# Patient Record
Sex: Female | Born: 1975 | Race: Black or African American | Hispanic: No | Marital: Single | Smoking: Never smoker
Health system: Southern US, Community
[De-identification: ages and names within clinical notes are randomized; demographics above are authoritative.]

---

## 1997-12-02 ENCOUNTER — Inpatient Hospital Stay (HOSPITAL_COMMUNITY): Admission: AD | Admit: 1997-12-02 | Discharge: 1997-12-02 | Payer: Self-pay | Admitting: Obstetrics & Gynecology

## 1997-12-03 ENCOUNTER — Inpatient Hospital Stay (HOSPITAL_COMMUNITY): Admission: AD | Admit: 1997-12-03 | Discharge: 1997-12-04 | Payer: Self-pay | Admitting: Obstetrics and Gynecology

## 1997-12-04 ENCOUNTER — Encounter (HOSPITAL_COMMUNITY): Admission: RE | Admit: 1997-12-04 | Discharge: 1998-03-04 | Payer: Self-pay | Admitting: *Deleted

## 2003-03-29 ENCOUNTER — Inpatient Hospital Stay (HOSPITAL_COMMUNITY): Admission: AD | Admit: 2003-03-29 | Discharge: 2003-04-01 | Payer: Self-pay | Admitting: Obstetrics and Gynecology

## 2003-05-13 ENCOUNTER — Other Ambulatory Visit: Admission: RE | Admit: 2003-05-13 | Discharge: 2003-05-13 | Payer: Self-pay | Admitting: Obstetrics and Gynecology

## 2005-02-15 ENCOUNTER — Inpatient Hospital Stay (HOSPITAL_COMMUNITY): Admission: EM | Admit: 2005-02-15 | Discharge: 2005-02-15 | Payer: Self-pay | Admitting: Obstetrics & Gynecology

## 2005-02-20 ENCOUNTER — Ambulatory Visit: Payer: Self-pay | Admitting: Family Medicine

## 2005-02-20 ENCOUNTER — Encounter (INDEPENDENT_AMBULATORY_CARE_PROVIDER_SITE_OTHER): Payer: Self-pay | Admitting: *Deleted

## 2005-03-08 ENCOUNTER — Ambulatory Visit: Payer: Self-pay | Admitting: Family Medicine

## 2005-03-21 ENCOUNTER — Ambulatory Visit: Payer: Self-pay | Admitting: Obstetrics & Gynecology

## 2005-04-04 ENCOUNTER — Ambulatory Visit: Payer: Self-pay | Admitting: Obstetrics & Gynecology

## 2005-04-08 ENCOUNTER — Inpatient Hospital Stay (HOSPITAL_COMMUNITY): Admission: AD | Admit: 2005-04-08 | Discharge: 2005-04-10 | Payer: Self-pay | Admitting: Family Medicine

## 2005-04-08 ENCOUNTER — Ambulatory Visit: Payer: Self-pay | Admitting: Family Medicine

## 2005-08-08 ENCOUNTER — Ambulatory Visit: Payer: Self-pay | Admitting: Obstetrics and Gynecology

## 2005-08-08 ENCOUNTER — Encounter: Payer: Self-pay | Admitting: Obstetrics and Gynecology

## 2005-08-10 ENCOUNTER — Emergency Department (HOSPITAL_COMMUNITY): Admission: EM | Admit: 2005-08-10 | Discharge: 2005-08-10 | Payer: Self-pay | Admitting: Emergency Medicine

## 2005-10-24 ENCOUNTER — Ambulatory Visit: Payer: Self-pay | Admitting: Gynecology

## 2006-01-10 ENCOUNTER — Ambulatory Visit: Payer: Self-pay | Admitting: Obstetrics and Gynecology

## 2006-04-17 ENCOUNTER — Ambulatory Visit: Payer: Self-pay | Admitting: Gynecology

## 2006-12-17 ENCOUNTER — Emergency Department (HOSPITAL_COMMUNITY): Admission: EM | Admit: 2006-12-17 | Discharge: 2006-12-17 | Payer: Self-pay | Admitting: Emergency Medicine

## 2006-12-25 ENCOUNTER — Emergency Department (HOSPITAL_COMMUNITY): Admission: EM | Admit: 2006-12-25 | Discharge: 2006-12-25 | Payer: Self-pay | Admitting: Family Medicine

## 2009-08-07 ENCOUNTER — Emergency Department (HOSPITAL_COMMUNITY): Admission: EM | Admit: 2009-08-07 | Discharge: 2009-08-07 | Payer: Self-pay | Admitting: Emergency Medicine

## 2010-05-26 NOTE — Op Note (Signed)
NAME:  Robin Rios, Robin Rios                      ACCOUNT NO.:  [id number]   MEDICAL RECORD NO.:  [id number]                   PATIENT TYPE:  INP   LOCATION:  9107                                 FACILITY:  [facility]   PHYSICIAN:  Juluis Mire, M.D.                DATE OF BIRTH:  02/19/1975   DATE OF PROCEDURE:  03/30/2003  DATE OF DISCHARGE:                                 OPERATIVE REPORT   DELIVERY NOTE:  The patient had a spontaneous vaginal delivery vertex of a  viable female.  Apgars were 8/9.  The pH is still pending.  We did encounter  a moderate shoulder dystocia.  This responded to the McRoberts maneuver.  We  then were able to rotate the posterior shoulder anteriorly, delivering the  infant.  There was some aftercoming meconium.  Previous fluid had been  clear.  The infant was vigorous and viable.  The team was therefore not  called.  Suctioning revealed clear mucus.  The infant was doing well and  moving both upper extremities.  There was no episiotomy.  No tear was noted.  The placenta was delivered intact with a three-vessel cord.  There was a  nuchal cord x1 that was easily reduced.  Anesthesia during labor was the  epidural.  Estimated blood loss was 400 mL.  Mother and baby are doing well  in the postpartum period.                                               Juluis Mire, M.D.    JSM/MEDQ  D:  03/30/2003  T:  03/31/2003  Job:  [id number]

## 2012-04-18 IMAGING — CR DG CERVICAL SPINE 2 OR 3 VIEWS
3 series · 3 of 3 positions shown · non-contrast
Comparison: 12/25/2006

CLINICAL DATA: Muscle spasms and left side of the neck.  No injury.

CERVICAL SPINE - 2-3 VIEW

[w c-spine lat]
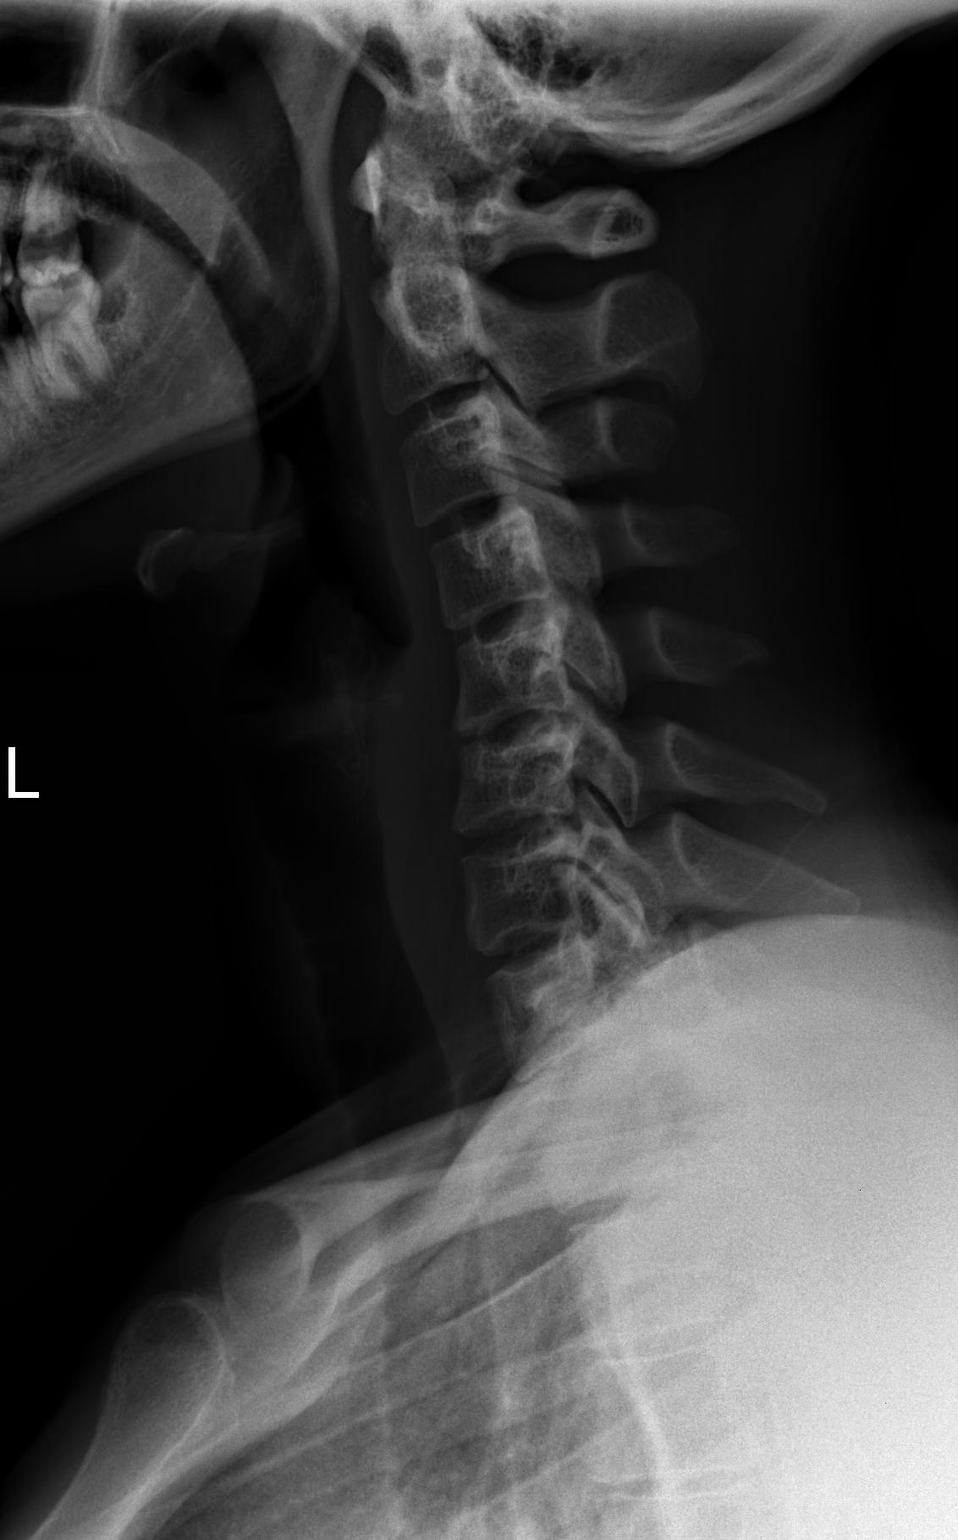

[w c-spine oblique]
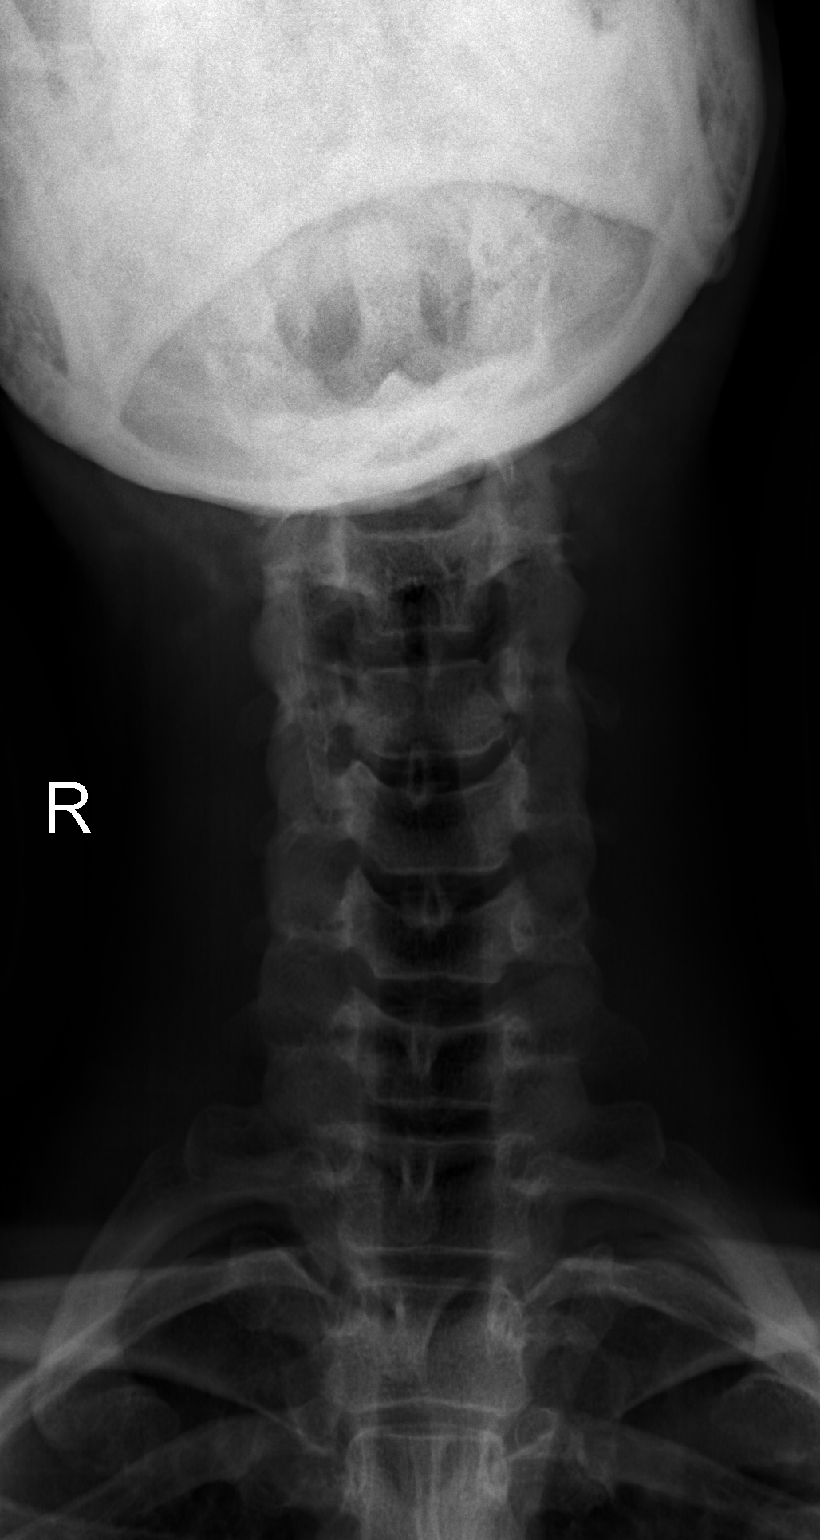

[w c-spine odontoid]
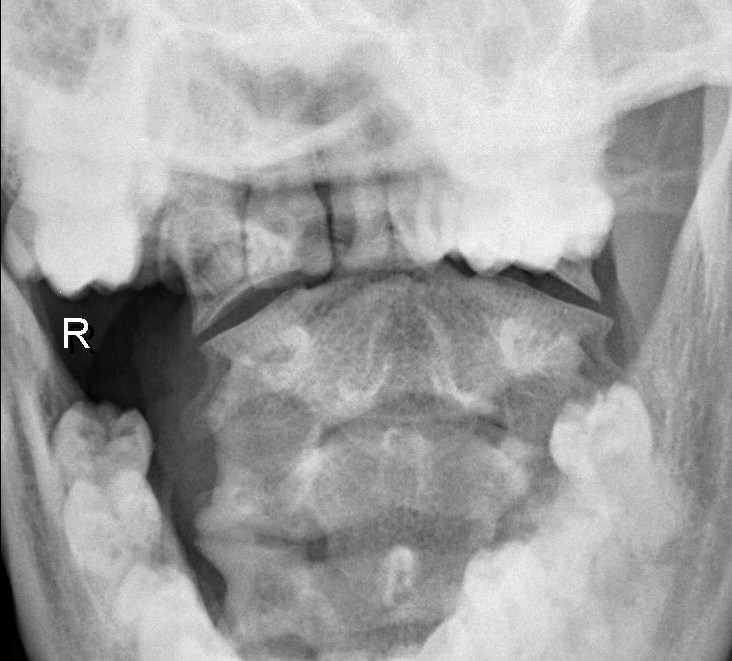

[3 of 3 positions shown; findings below may reference images not displayed]

FINDINGS: There is loss of normal cervical lordosis, similar to
prior study.  Prevertebral soft tissues are normal.  No
subluxation.  No fracture.
IMPRESSION: Cervical straightening.  No acute bony abnormality or change since
prior study.

## 2014-11-05 ENCOUNTER — Other Ambulatory Visit (HOSPITAL_COMMUNITY)
Admission: RE | Admit: 2014-11-05 | Discharge: 2014-11-05 | Disposition: A | Discharge status: Home / Self Care | Payer: Medicaid Other | Source: Ambulatory Visit | Attending: Family Medicine | Admitting: Family Medicine

## 2014-11-05 ENCOUNTER — Ambulatory Visit (INDEPENDENT_AMBULATORY_CARE_PROVIDER_SITE_OTHER): Payer: Medicaid Other | Admitting: Family Medicine

## 2014-11-05 ENCOUNTER — Encounter: Payer: Self-pay | Admitting: Family Medicine

## 2014-11-05 VITALS — BP 127/76 | HR 78 | Temp 98.6°F | Ht 68.0 in | Wt 152.0 lb

## 2014-11-05 DIAGNOSIS — Z124 Encounter for screening for malignant neoplasm of cervix: Secondary | ICD-10-CM

## 2014-11-05 DIAGNOSIS — Z01419 Encounter for gynecological examination (general) (routine) without abnormal findings: Secondary | ICD-10-CM | POA: Diagnosis not present

## 2014-11-05 DIAGNOSIS — Z30431 Encounter for routine checking of intrauterine contraceptive device: Secondary | ICD-10-CM | POA: Diagnosis not present

## 2014-11-05 DIAGNOSIS — Z1151 Encounter for screening for human papillomavirus (HPV): Secondary | ICD-10-CM | POA: Diagnosis not present

## 2014-11-05 DIAGNOSIS — Z975 Presence of (intrauterine) contraceptive device: Secondary | ICD-10-CM | POA: Diagnosis not present

## 2014-11-05 DIAGNOSIS — Z30014 Encounter for initial prescription of intrauterine contraceptive device: Secondary | ICD-10-CM

## 2014-11-05 DIAGNOSIS — Z538 Procedure and treatment not carried out for other reasons: Secondary | ICD-10-CM

## 2014-11-05 NOTE — Patient Instructions (Signed)
Preventive Care for Adults, Female A healthy lifestyle and preventive care can promote health and wellness. Preventive health guidelines for women include the following key practices.  A routine yearly physical is a good way to check with your health care provider about your health and preventive screening. It is a chance to share any concerns and updates on your health and to receive a thorough exam.  Visit your dentist for a routine exam and preventive care every 6 months. Brush your teeth twice a day and floss once a day. Good oral hygiene prevents tooth decay and gum disease.  The frequency of eye exams is based on your age, health, family medical history, use of contact lenses, and other factors. Follow your health care provider's recommendations for frequency of eye exams.  Eat a healthy diet. Foods like vegetables, fruits, whole grains, low-fat dairy products, and lean protein foods contain the nutrients you need without too many calories. Decrease your intake of foods high in solid fats, added sugars, and salt. Eat the right amount of calories for you.Get information about a proper diet from your health care provider, if necessary.  Regular physical exercise is one of the most important things you can do for your health. Most adults should get at least 150 minutes of moderate-intensity exercise (any activity that increases your heart rate and causes you to sweat) each week. In addition, most adults need muscle-strengthening exercises on 2 or more days a week.  Maintain a healthy weight. The body mass index (BMI) is a screening tool to identify possible weight problems. It provides an estimate of body fat based on height and weight. Your health care provider can find your BMI and can help you achieve or maintain a healthy weight.For adults 20 years and older:  A BMI below 18.5 is considered underweight.  A BMI of 18.5 to 24.9 is normal.  A BMI of 25 to 29.9 is considered  overweight.  A BMI of 30 and above is considered obese.  Maintain normal blood lipids and cholesterol levels by exercising and minimizing your intake of saturated fat. Eat a balanced diet with plenty of fruit and vegetables. Blood tests for lipids and cholesterol should begin at age 64 and be repeated every 5 years. If your lipid or cholesterol levels are high, you are over 50, or you are at high risk for heart disease, you may need your cholesterol levels checked more frequently.Ongoing high lipid and cholesterol levels should be treated with medicines if diet and exercise are not working.  If you smoke, find out from your health care provider how to quit. If you do not use tobacco, do not start.  Lung cancer screening is recommended for adults aged 52-80 years who are at high risk for developing lung cancer because of a history of smoking. A yearly low-dose CT scan of the lungs is recommended for people who have at least a 30-pack-year history of smoking and are a current smoker or have quit within the past 15 years. A pack year of smoking is smoking an average of 1 pack of cigarettes a day for 1 year (for example: 1 pack a day for 30 years or 2 packs a day for 15 years). Yearly screening should continue until the smoker has stopped smoking for at least 15 years. Yearly screening should be stopped for people who develop a health problem that would prevent them from having lung cancer treatment.  If you are pregnant, do not drink alcohol. If you are  breastfeeding, be very cautious about drinking alcohol. If you are not pregnant and choose to drink alcohol, do not have more than 1 drink per day. One drink is considered to be 12 ounces (355 mL) of beer, 5 ounces (148 mL) of wine, or 1.5 ounces (44 mL) of liquor.  Avoid use of street drugs. Do not share needles with anyone. Ask for help if you need support or instructions about stopping the use of drugs.  High blood pressure causes heart disease and  increases the risk of stroke. Your blood pressure should be checked at least every 1 to 2 years. Ongoing high blood pressure should be treated with medicines if weight loss and exercise do not work.  If you are 25-78 years old, ask your health care provider if you should take aspirin to prevent strokes.  Diabetes screening is done by taking a blood sample to check your blood glucose level after you have not eaten for a certain period of time (fasting). If you are not overweight and you do not have risk factors for diabetes, you should be screened once every 3 years starting at age 86. If you are overweight or obese and you are 3-87 years of age, you should be screened for diabetes every year as part of your cardiovascular risk assessment.  Breast cancer screening is essential preventive care for women. You should practice "breast self-awareness." This means understanding the normal appearance and feel of your breasts and may include breast self-examination. Any changes detected, no matter how small, should be reported to a health care provider. Women in their 66s and 30s should have a clinical breast exam (CBE) by a health care provider as part of a regular health exam every 1 to 3 years. After age 43, women should have a CBE every year. Starting at age 37, women should consider having a mammogram (breast X-ray test) every year. Women who have a family history of breast cancer should talk to their health care provider about genetic screening. Women at a high risk of breast cancer should talk to their health care providers about having an MRI and a mammogram every year.  Breast cancer gene (BRCA)-related cancer risk assessment is recommended for women who have family members with BRCA-related cancers. BRCA-related cancers include breast, ovarian, tubal, and peritoneal cancers. Having family members with these cancers may be associated with an increased risk for harmful changes (mutations) in the breast  cancer genes BRCA1 and BRCA2. Results of the assessment will determine the need for genetic counseling and BRCA1 and BRCA2 testing.  Your health care provider may recommend that you be screened regularly for cancer of the pelvic organs (ovaries, uterus, and vagina). This screening involves a pelvic examination, including checking for microscopic changes to the surface of your cervix (Pap test). You may be encouraged to have this screening done every 3 years, beginning at age 78.  For women ages 79-65, health care providers may recommend pelvic exams and Pap testing every 3 years, or they may recommend the Pap and pelvic exam, combined with testing for human papilloma virus (HPV), every 5 years. Some types of HPV increase your risk of cervical cancer. Testing for HPV may also be done on women of any age with unclear Pap test results.  Other health care providers may not recommend any screening for nonpregnant women who are considered low risk for pelvic cancer and who do not have symptoms. Ask your health care provider if a screening pelvic exam is right for  you.  If you have had past treatment for cervical cancer or a condition that could lead to cancer, you need Pap tests and screening for cancer for at least 20 years after your treatment. If Pap tests have been discontinued, your risk factors (such as having a new sexual partner) need to be reassessed to determine if screening should resume. Some women have medical problems that increase the chance of getting cervical cancer. In these cases, your health care provider may recommend more frequent screening and Pap tests.  Colorectal cancer can be detected and often prevented. Most routine colorectal cancer screening begins at the age of 50 years and continues through age 75 years. However, your health care provider may recommend screening at an earlier age if you have risk factors for colon cancer. On a yearly basis, your health care provider may provide  home test kits to check for hidden blood in the stool. Use of a small camera at the end of a tube, to directly examine the colon (sigmoidoscopy or colonoscopy), can detect the earliest forms of colorectal cancer. Talk to your health care provider about this at age 50, when routine screening begins. Direct exam of the colon should be repeated every 5-10 years through age 75 years, unless early forms of precancerous polyps or small growths are found.  People who are at an increased risk for hepatitis B should be screened for this virus. You are considered at high risk for hepatitis B if:  You were born in a country where hepatitis B occurs often. Talk with your health care provider about which countries are considered high risk.  Your parents were born in a high-risk country and you have not received a shot to protect against hepatitis B (hepatitis B vaccine).  You have HIV or AIDS.  You use needles to inject street drugs.  You live with, or have sex with, someone who has hepatitis B.  You get hemodialysis treatment.  You take certain medicines for conditions like cancer, organ transplantation, and autoimmune conditions.  Hepatitis C blood testing is recommended for all people born from 1945 through 1965 and any individual with known risks for hepatitis C.  Practice safe sex. Use condoms and avoid high-risk sexual practices to reduce the spread of sexually transmitted infections (STIs). STIs include gonorrhea, chlamydia, syphilis, trichomonas, herpes, HPV, and human immunodeficiency virus (HIV). Herpes, HIV, and HPV are viral illnesses that have no cure. They can result in disability, cancer, and death.  You should be screened for sexually transmitted illnesses (STIs) including gonorrhea and chlamydia if:  You are sexually active and are younger than 24 years.  You are older than 24 years and your health care provider tells you that you are at risk for this type of infection.  Your sexual  activity has changed since you were last screened and you are at an increased risk for chlamydia or gonorrhea. Ask your health care provider if you are at risk.  If you are at risk of being infected with HIV, it is recommended that you take a prescription medicine daily to prevent HIV infection. This is called preexposure prophylaxis (PrEP). You are considered at risk if:  You are sexually active and do not regularly use condoms or know the HIV status of your partner(s).  You take drugs by injection.  You are sexually active with a partner who has HIV.  Talk with your health care provider about whether you are at high risk of being infected with HIV. If   you choose to begin PrEP, you should first be tested for HIV. You should then be tested every 3 months for as long as you are taking PrEP.  Osteoporosis is a disease in which the bones lose minerals and strength with aging. This can result in serious bone fractures or breaks. The risk of osteoporosis can be identified using a bone density scan. Women ages 1 years and over and women at risk for fractures or osteoporosis should discuss screening with their health care providers. Ask your health care provider whether you should take a calcium supplement or vitamin D to reduce the rate of osteoporosis.  Menopause can be associated with physical symptoms and risks. Hormone replacement therapy is available to decrease symptoms and risks. You should talk to your health care provider about whether hormone replacement therapy is right for you.  Use sunscreen. Apply sunscreen liberally and repeatedly throughout the day. You should seek shade when your shadow is shorter than you. Protect yourself by wearing long sleeves, pants, a wide-brimmed hat, and sunglasses year round, whenever you are outdoors.  Once a month, do a whole body skin exam, using a mirror to look at the skin on your back. Tell your health care provider of new moles, moles that have irregular  borders, moles that are larger than a pencil eraser, or moles that have changed in shape or color.  Stay current with required vaccines (immunizations).  Influenza vaccine. All adults should be immunized every year.  Tetanus, diphtheria, and acellular pertussis (Td, Tdap) vaccine. Pregnant women should receive 1 dose of Tdap vaccine during each pregnancy. The dose should be obtained regardless of the length of time since the last dose. Immunization is preferred during the 27th-36th week of gestation. An adult who has not previously received Tdap or who does not know her vaccine status should receive 1 dose of Tdap. This initial dose should be followed by tetanus and diphtheria toxoids (Td) booster doses every 10 years. Adults with an unknown or incomplete history of completing a 3-dose immunization series with Td-containing vaccines should begin or complete a primary immunization series including a Tdap dose. Adults should receive a Td booster every 10 years.  Varicella vaccine. An adult without evidence of immunity to varicella should receive 2 doses or a second dose if she has previously received 1 dose. Pregnant females who do not have evidence of immunity should receive the first dose after pregnancy. This first dose should be obtained before leaving the health care facility. The second dose should be obtained 4-8 weeks after the first dose.  Human papillomavirus (HPV) vaccine. Females aged 13-26 years who have not received the vaccine previously should obtain the 3-dose series. The vaccine is not recommended for use in pregnant females. However, pregnancy testing is not needed before receiving a dose. If a female is found to be pregnant after receiving a dose, no treatment is needed. In that case, the remaining doses should be delayed until after the pregnancy. Immunization is recommended for any person with an immunocompromised condition through the age of 24 years if she did not get any or all doses  earlier. During the 3-dose series, the second dose should be obtained 4-8 weeks after the first dose. The third dose should be obtained 24 weeks after the first dose and 16 weeks after the second dose.  Zoster vaccine. One dose is recommended for adults aged 97 years or older unless certain conditions are present.  Measles, mumps, and rubella (MMR) vaccine. Adults born  before 1957 generally are considered immune to measles and mumps. Adults born in [birth year] or later should have 1 or more doses of MMR vaccine unless there is a contraindication to the vaccine or there is laboratory evidence of immunity to each of the three diseases. A routine second dose of MMR vaccine should be obtained at least 28 days after the first dose for students attending postsecondary schools, health care workers, or international travelers. People who received inactivated measles vaccine or an unknown type of measles vaccine during 1963-1967 should receive 2 doses of MMR vaccine. People who received inactivated mumps vaccine or an unknown type of mumps vaccine before 1979 and are at high risk for mumps infection should consider immunization with 2 doses of MMR vaccine. For females of childbearing age, rubella immunity should be determined. If there is no evidence of immunity, females who are not pregnant should be vaccinated. If there is no evidence of immunity, females who are pregnant should delay immunization until after pregnancy. Unvaccinated health care workers born before 60 who lack laboratory evidence of measles, mumps, or rubella immunity or laboratory confirmation of disease should consider measles and mumps immunization with 2 doses of MMR vaccine or rubella immunization with 1 dose of MMR vaccine.  Pneumococcal 13-valent conjugate (PCV13) vaccine. When indicated, a person who is uncertain of his immunization history and has no record of immunization should receive the PCV13 vaccine. All adults 61 years of age and older  should receive this vaccine. An adult aged 92 years or older who has certain medical conditions and has not been previously immunized should receive 1 dose of PCV13 vaccine. This PCV13 should be followed with a dose of pneumococcal polysaccharide (PPSV23) vaccine. Adults who are at high risk for pneumococcal disease should obtain the PPSV23 vaccine at least 8 weeks after the dose of PCV13 vaccine. Adults older than 40 years of age who have normal immune system function should obtain the PPSV23 vaccine dose at least 1 year after the dose of PCV13 vaccine.  Pneumococcal polysaccharide (PPSV23) vaccine. When PCV13 is also indicated, PCV13 should be obtained first. All adults aged 2 years and older should be immunized. An adult younger than age 30 years who has certain medical conditions should be immunized. Any person who resides in a nursing home or long-term care facility should be immunized. An adult smoker should be immunized. People with an immunocompromised condition and certain other conditions should receive both PCV13 and PPSV23 vaccines. People with human immunodeficiency virus (HIV) infection should be immunized as soon as possible after diagnosis. Immunization during chemotherapy or radiation therapy should be avoided. Routine use of PPSV23 vaccine is not recommended for American Indians, [place] Natives, or people younger than 65 years unless there are medical conditions that require PPSV23 vaccine. When indicated, people who have unknown immunization and have no record of immunization should receive PPSV23 vaccine. One-time revaccination 5 years after the first dose of PPSV23 is recommended for people aged 19-64 years who have chronic kidney failure, nephrotic syndrome, asplenia, or immunocompromised conditions. People who received 1-2 doses of PPSV23 before age 44 years should receive another dose of PPSV23 vaccine at age 83 years or later if at least 5 years have passed since the previous dose. Doses  of PPSV23 are not needed for people immunized with PPSV23 at or after age 20 years.  Meningococcal vaccine. Adults with asplenia or persistent complement component deficiencies should receive 2 doses of quadrivalent meningococcal conjugate (MenACWY-D) vaccine. The doses should be obtained  at least 2 months apart. Microbiologists working with certain meningococcal bacteria, [place] recruits, people at risk during an outbreak, and people who travel to or live in countries with a high rate of meningitis should be immunized. A first-year college student up through age 28 years who is living in a residence hall should receive a dose if she did not receive a dose on or after her 16th birthday. Adults who have certain high-risk conditions should receive one or more doses of vaccine.  Hepatitis A vaccine. Adults who wish to be protected from this disease, have certain high-risk conditions, work with hepatitis A-infected animals, work in hepatitis A research labs, or travel to or work in countries with a high rate of hepatitis A should be immunized. Adults who were previously unvaccinated and who anticipate close contact with an international adoptee during the first 60 days after arrival in the [place] from a country with a high rate of hepatitis A should be immunized.  Hepatitis B vaccine. Adults who wish to be protected from this disease, have certain high-risk conditions, may be exposed to blood or other infectious body fluids, are household contacts or sex partners of hepatitis B positive people, are clients or workers in certain care facilities, or travel to or work in countries with a high rate of hepatitis B should be immunized.  Haemophilus influenzae type b (Hib) vaccine. A previously unvaccinated person with asplenia or sickle cell disease or having a scheduled splenectomy should receive 1 dose of Hib vaccine. Regardless of previous immunization, a recipient of a hematopoietic stem cell transplant  should receive a 3-dose series 6-12 months after her successful transplant. Hib vaccine is not recommended for adults with HIV infection. Preventive Services / Frequency Ages 71 to 87 years  Blood pressure check.** / Every 3-5 years.  Lipid and cholesterol check.** / Every 5 years beginning at age 1.  Clinical breast exam.** / Every 3 years for women in their 3s and 31s.  BRCA-related cancer risk assessment.** / For women who have family members with a BRCA-related cancer (breast, ovarian, tubal, or peritoneal cancers).  Pap test.** / Every 2 years from ages 50 through 86. Every 3 years starting at age 87 through age 7 or 75 with a history of 3 consecutive normal Pap tests.  HPV screening.** / Every 3 years from ages 59 through ages 35 to 6 with a history of 3 consecutive normal Pap tests.  Hepatitis C blood test.** / For any individual with known risks for hepatitis C.  Skin self-exam. / Monthly.  Influenza vaccine. / Every year.  Tetanus, diphtheria, and acellular pertussis (Tdap, Td) vaccine.** / Consult your health care provider. Pregnant women should receive 1 dose of Tdap vaccine during each pregnancy. 1 dose of Td every 10 years.  Varicella vaccine.** / Consult your health care provider. Pregnant females who do not have evidence of immunity should receive the first dose after pregnancy.  HPV vaccine. / 3 doses over 6 months, if 72 and younger. The vaccine is not recommended for use in pregnant females. However, pregnancy testing is not needed before receiving a dose.  Measles, mumps, rubella (MMR) vaccine.** / You need at least 1 dose of MMR if you were born in 1957 or later. You may also need a 2nd dose. For females of childbearing age, rubella immunity should be determined. If there is no evidence of immunity, females who are not pregnant should be vaccinated. If there is no evidence of immunity, females who are  pregnant should delay immunization until after  pregnancy.  Pneumococcal 13-valent conjugate (PCV13) vaccine.** / Consult your health care provider.  Pneumococcal polysaccharide (PPSV23) vaccine.** / 1 to 2 doses if you smoke cigarettes or if you have certain conditions.  Meningococcal vaccine.** / 1 dose if you are age 87 to 44 years and a Market researcher living in a residence hall, or have one of several medical conditions, you need to get vaccinated against meningococcal disease. You may also need additional booster doses.  Hepatitis A vaccine.** / Consult your health care provider.  Hepatitis B vaccine.** / Consult your health care provider.  Haemophilus influenzae type b (Hib) vaccine.** / Consult your health care provider. Ages 86 to 38 years  Blood pressure check.** / Every year.  Lipid and cholesterol check.** / Every 5 years beginning at age 49 years.  Lung cancer screening. / Every year if you are aged 71-80 years and have a 30-pack-year history of smoking and currently smoke or have quit within the past 15 years. Yearly screening is stopped once you have quit smoking for at least 15 years or develop a health problem that would prevent you from having lung cancer treatment.  Clinical breast exam.** / Every year after age 51 years.  BRCA-related cancer risk assessment.** / For women who have family members with a BRCA-related cancer (breast, ovarian, tubal, or peritoneal cancers).  Mammogram.** / Every year beginning at age 18 years and continuing for as long as you are in good health. Consult with your health care provider.  Pap test.** / Every 3 years starting at age 63 years through age 37 or 57 years with a history of 3 consecutive normal Pap tests.  HPV screening.** / Every 3 years from ages 41 years through ages 76 to 23 years with a history of 3 consecutive normal Pap tests.  Fecal occult blood test (FOBT) of stool. / Every year beginning at age 36 years and continuing until age 51 years. You may not need  to do this test if you get a colonoscopy every 10 years.  Flexible sigmoidoscopy or colonoscopy.** / Every 5 years for a flexible sigmoidoscopy or every 10 years for a colonoscopy beginning at age 36 years and continuing until age 35 years.  Hepatitis C blood test.** / For all people born from [birth year] through 1965 and any individual with known risks for hepatitis C.  Skin self-exam. / Monthly.  Influenza vaccine. / Every year.  Tetanus, diphtheria, and acellular pertussis (Tdap/Td) vaccine.** / Consult your health care provider. Pregnant women should receive 1 dose of Tdap vaccine during each pregnancy. 1 dose of Td every 10 years.  Varicella vaccine.** / Consult your health care provider. Pregnant females who do not have evidence of immunity should receive the first dose after pregnancy.  Zoster vaccine.** / 1 dose for adults aged 73 years or older.  Measles, mumps, rubella (MMR) vaccine.** / You need at least 1 dose of MMR if you were born in 1957 or later. You may also need a second dose. For females of childbearing age, rubella immunity should be determined. If there is no evidence of immunity, females who are not pregnant should be vaccinated. If there is no evidence of immunity, females who are pregnant should delay immunization until after pregnancy.  Pneumococcal 13-valent conjugate (PCV13) vaccine.** / Consult your health care provider.  Pneumococcal polysaccharide (PPSV23) vaccine.** / 1 to 2 doses if you smoke cigarettes or if you have certain conditions.  Meningococcal vaccine.** /  Consult your health care provider.  Hepatitis A vaccine.** / Consult your health care provider.  Hepatitis B vaccine.** / Consult your health care provider.  Haemophilus influenzae type b (Hib) vaccine.** / Consult your health care provider. Ages 80 years and over  Blood pressure check.** / Every year.  Lipid and cholesterol check.** / Every 5 years beginning at age 62 years.  Lung cancer  screening. / Every year if you are aged 32-80 years and have a 30-pack-year history of smoking and currently smoke or have quit within the past 15 years. Yearly screening is stopped once you have quit smoking for at least 15 years or develop a health problem that would prevent you from having lung cancer treatment.  Clinical breast exam.** / Every year after age 61 years.  BRCA-related cancer risk assessment.** / For women who have family members with a BRCA-related cancer (breast, ovarian, tubal, or peritoneal cancers).  Mammogram.** / Every year beginning at age 39 years and continuing for as long as you are in good health. Consult with your health care provider.  Pap test.** / Every 3 years starting at age 85 years through age 74 or 72 years with 3 consecutive normal Pap tests. Testing can be stopped between 65 and 70 years with 3 consecutive normal Pap tests and no abnormal Pap or HPV tests in the past 10 years.  HPV screening.** / Every 3 years from ages 55 years through ages 67 or 77 years with a history of 3 consecutive normal Pap tests. Testing can be stopped between 65 and 70 years with 3 consecutive normal Pap tests and no abnormal Pap or HPV tests in the past 10 years.  Fecal occult blood test (FOBT) of stool. / Every year beginning at age 81 years and continuing until age 22 years. You may not need to do this test if you get a colonoscopy every 10 years.  Flexible sigmoidoscopy or colonoscopy.** / Every 5 years for a flexible sigmoidoscopy or every 10 years for a colonoscopy beginning at age 67 years and continuing until age 22 years.  Hepatitis C blood test.** / For all people born from [birth year] through 1966 and any individual with known risks for hepatitis C.  Osteoporosis screening.** / A one-time screening for women ages 8 years and over and women at risk for fractures or osteoporosis.  Skin self-exam. / Monthly.  Influenza vaccine. / Every year.  Tetanus, diphtheria, and  acellular pertussis (Tdap/Td) vaccine.** / 1 dose of Td every 10 years.  Varicella vaccine.** / Consult your health care provider.  Zoster vaccine.** / 1 dose for adults aged 56 years or older.  Pneumococcal 13-valent conjugate (PCV13) vaccine.** / Consult your health care provider.  Pneumococcal polysaccharide (PPSV23) vaccine.** / 1 dose for all adults aged 15 years and older.  Meningococcal vaccine.** / Consult your health care provider.  Hepatitis A vaccine.** / Consult your health care provider.  Hepatitis B vaccine.** / Consult your health care provider.  Haemophilus influenzae type b (Hib) vaccine.** / Consult your health care provider. ** Family history and personal history of risk and conditions may change your health care provider's recommendations.   This information is not intended to replace advice given to you by your health care provider. Make sure you discuss any questions you have with your health care provider.   Document Released: 02/20/2001 Document Revised: 01/15/2014 Document Reviewed: 05/22/2010 Elsevier Interactive Patient Education [employer].

## 2014-11-05 NOTE — Progress Notes (Signed)
  Subjective:     Robin Rios is a 39 y.o. female and is here for a comprehensive physical exam. The patient reports no problems.  Has IUD that was placed 5 years ago.  No problems.  Infrequently sees physician  Social History   Social History  . Marital Status: Single    Spouse Name: N/A  . Number of Children: N/A  . Years of Education: N/A   Occupational History  . Not on file.   Social History Main Topics  . Smoking status: Never Smoker   . Smokeless tobacco: Never Used  . Alcohol Use: No  . Drug Use: Yes    Special: Marijuana     Comment: occasionally  . Sexual Activity: No   Other Topics Concern  . Not on file   Social History Narrative  . No narrative on file   Health Maintenance  Topic Date Due  . HIV Screening  05/10/1989  . TETANUS/TDAP  05/10/1993  . PAP SMEAR  05/11/1995  . INFLUENZA VACCINE  08/09/2014    The following portions of the patient's history were reviewed and updated as appropriate: allergies, current medications, past family history, past medical history, past social history, past surgical history and problem list.  Review of Systems Pertinent items are noted in HPI.   Objective:    BP 127/76 mmHg  Pulse 78  Temp(Src) 98.6 F (37 C) (Oral)  Ht 5\' 8"  (1.727 m)  Wt 152 lb (68.947 kg)  BMI 23.12 kg/m2  LMP 10/25/2014 General appearance: alert, cooperative and no distress Head: Normocephalic, without obvious abnormality, atraumatic Eyes: conjunctivae/corneas clear. PERRL, EOM's intact. Fundi benign. Neck: no adenopathy, no carotid bruit, no JVD, supple, symmetrical, trachea midline and thyroid not enlarged, symmetric, no tenderness/mass/nodules Lungs: clear to auscultation bilaterally Breasts: normal appearance, no masses or tenderness Heart: regular rate and rhythm, S1, S2 normal, no murmur, click, rub or gallop Abdomen: soft, non-tender; bowel sounds normal; no masses,  no organomegaly Pelvic: cervix normal in appearance,  external genitalia normal, no adnexal masses or tenderness, no cervical motion tenderness, rectovaginal septum normal, uterus normal size, shape, and consistency and vagina normal without discharge.  No strings visible Extremities: extremities normal, atraumatic, no cyanosis or edema Pulses: 2+ and symmetric Skin: Skin color, texture, turgor normal. No rashes or lesions Neurologic: Alert and oriented X 3, normal strength and tone. Normal symmetric reflexes. Normal coordination and gait    Assessment:    Healthy female exam. Retained IUD      Plan:      PAP done Attempted to use cytobrush to grasp IUD strings, but was unsuccessful.  Also tried to use long kelly clamp and IUD hook, but was unsuccessful.  Schedule US to confirm IUD presence.  Fasting lipids, CMP.  See After Visit Summary for Counseling Recommendations

## 2014-11-06 LAB — WET PREP, GENITAL
Trich, Wet Prep: NONE SEEN
YEAST WET PREP: NONE SEEN

## 2014-11-08 ENCOUNTER — Other Ambulatory Visit: Payer: Self-pay | Admitting: Family Medicine

## 2014-11-08 MED ORDER — METRONIDAZOLE 500 MG PO TABS: 500.0000 mg | ORAL_TABLET | Freq: Two times a day (BID) | ORAL | Status: DC

## 2014-11-09 ENCOUNTER — Telehealth: Payer: Self-pay | Admitting: General Practice

## 2014-11-09 NOTE — Telephone Encounter (Signed)
Per Dr Adrian BlackwaterStinson, patient has BV. Flagyl has been sent to pharmacy. Called patient, no answer- unable to leave message. Phone kept ringing

## 2014-11-10 ENCOUNTER — Ambulatory Visit (HOSPITAL_COMMUNITY)
Admission: RE | Admit: 2014-11-10 | Discharge: 2014-11-10 | Disposition: A | Discharge status: Home / Self Care | Payer: Medicaid Other | Source: Ambulatory Visit | Attending: Family Medicine | Admitting: Family Medicine

## 2014-11-10 ENCOUNTER — Telehealth: Payer: Self-pay | Admitting: General Practice

## 2014-11-10 DIAGNOSIS — Z975 Presence of (intrauterine) contraceptive device: Secondary | ICD-10-CM

## 2014-11-10 DIAGNOSIS — Z30431 Encounter for routine checking of intrauterine contraceptive device: Secondary | ICD-10-CM | POA: Insufficient documentation

## 2014-11-10 DIAGNOSIS — Z538 Procedure and treatment not carried out for other reasons: Secondary | ICD-10-CM

## 2014-11-10 LAB — CYTOLOGY - PAP

## 2014-11-10 NOTE — Telephone Encounter (Signed)
Per Dr Adrian BlackwaterStinson, + HPV on pap. needs PAP and HPV in 1 year. Also, US showed IUD. Will schedule her for hysteroscopy to remove IUD. Called patient and phone kept ringing. Called emergency contact and her mother states the patient isn't in right now but would tell her we called.

## 2014-11-10 NOTE — Telephone Encounter (Signed)
Opened in error

## 2014-11-15 NOTE — Telephone Encounter (Signed)
Patient called and left message stating she is calling for her results. Patient states her other phone is messing up and she has a new number of [phone]. Patient states we can reach her there. Called patient and informed her of all results and recommendations. Discussed with patient that she will be receiving a letter in the mail and to call us the week of thanksgiving if she hasn't received that letter. Patient verbalized understanding to all and is aware to avoid alcohol while on flagyl. Patient had no questions

## 2014-11-24 ENCOUNTER — Ambulatory Visit
Admission: RE | Admit: 2014-11-24 | Discharge: 2014-11-24 | Disposition: A | Discharge status: Home / Self Care | Payer: Medicaid Other | Source: Ambulatory Visit | Attending: Family Medicine | Admitting: Family Medicine

## 2014-11-24 DIAGNOSIS — Z01419 Encounter for gynecological examination (general) (routine) without abnormal findings: Secondary | ICD-10-CM

## 2014-12-01 ENCOUNTER — Encounter (HOSPITAL_COMMUNITY): Payer: Self-pay | Admitting: *Deleted

## 2015-02-01 ENCOUNTER — Encounter (HOSPITAL_COMMUNITY)
Admission: RE | Disposition: A | Discharge status: Home / Self Care | Payer: Self-pay | Source: Ambulatory Visit | Attending: Obstetrics & Gynecology

## 2015-02-01 ENCOUNTER — Ambulatory Visit (HOSPITAL_COMMUNITY)
Admission: RE | Admit: 2015-02-01 | Discharge: 2015-02-01 | Disposition: A | Discharge status: Home / Self Care | Payer: Medicaid Other | Source: Ambulatory Visit | Attending: Obstetrics & Gynecology | Admitting: Obstetrics & Gynecology

## 2015-02-01 ENCOUNTER — Ambulatory Visit (HOSPITAL_COMMUNITY): Payer: Medicaid Other | Admitting: Anesthesiology

## 2015-02-01 ENCOUNTER — Encounter (HOSPITAL_COMMUNITY): Payer: Self-pay | Admitting: *Deleted

## 2015-02-01 DIAGNOSIS — Z30433 Encounter for removal and reinsertion of intrauterine contraceptive device: Secondary | ICD-10-CM

## 2015-02-01 DIAGNOSIS — T8332XD Displacement of intrauterine contraceptive device, subsequent encounter: Secondary | ICD-10-CM

## 2015-02-01 HISTORY — PX: IUD REMOVAL: SHX5392

## 2015-02-01 LAB — CBC
HCT: 42.6 % (ref 36.0–46.0)
Hemoglobin: 14.5 g/dL (ref 12.0–15.0)
MCH: 32.7 pg (ref 26.0–34.0)
MCHC: 34 g/dL (ref 30.0–36.0)
MCV: 95.9 fL (ref 78.0–100.0)
PLATELETS: 209 10*3/uL (ref 150–400)
RBC: 4.44 MIL/uL (ref 3.87–5.11)
RDW: 13.2 % (ref 11.5–15.5)
WBC: 8.1 10*3/uL (ref 4.0–10.5)

## 2015-02-01 LAB — PREGNANCY, URINE: PREG TEST UR: NEGATIVE

## 2015-02-01 SURGERY — REMOVAL, INTRAUTERINE DEVICE
Anesthesia: Monitor Anesthesia Care | Site: Vagina

## 2015-02-01 MED ORDER — MIDAZOLAM HCL 2 MG/2ML IJ SOLN
INTRAMUSCULAR | Status: DC | PRN
  Administered 2015-02-01: 2 mg via INTRAVENOUS

## 2015-02-01 MED ORDER — LACTATED RINGERS IV SOLN: INTRAVENOUS | Status: DC

## 2015-02-01 MED ORDER — IBUPROFEN 600 MG PO TABS: 600.0000 mg | ORAL_TABLET | Freq: Four times a day (QID) | ORAL | Status: DC | PRN

## 2015-02-01 MED ORDER — FENTANYL CITRATE (PF) 100 MCG/2ML IJ SOLN
INTRAMUSCULAR | Status: DC | PRN
  Administered 2015-02-01: 100 ug via INTRAVENOUS
  Administered 2015-02-01: 50 ug via INTRAVENOUS

## 2015-02-01 MED ORDER — KETOROLAC TROMETHAMINE 30 MG/ML IJ SOLN
INTRAMUSCULAR | Status: AC
  Filled 2015-02-01: qty 1

## 2015-02-01 MED ORDER — KETOROLAC TROMETHAMINE 30 MG/ML IJ SOLN
INTRAMUSCULAR | Status: DC | PRN
  Administered 2015-02-01: 30 mg via INTRAVENOUS

## 2015-02-01 MED ORDER — PROPOFOL 500 MG/50ML IV EMUL
INTRAVENOUS | Status: DC | PRN
Start: 2015-02-01 — End: 2015-02-01
  Administered 2015-02-01: 150 ug/kg/min via INTRAVENOUS

## 2015-02-01 MED ORDER — CITRIC ACID-SODIUM CITRATE 334-500 MG/5ML PO SOLN
ORAL | Status: AC
  Filled 2015-02-01: qty 15

## 2015-02-01 MED ORDER — ONDANSETRON HCL 4 MG/2ML IJ SOLN
INTRAMUSCULAR | Status: DC | PRN
  Administered 2015-02-01: 4 mg via INTRAVENOUS

## 2015-02-01 MED ORDER — MIDAZOLAM HCL 2 MG/2ML IJ SOLN
INTRAMUSCULAR | Status: AC
  Filled 2015-02-01: qty 2

## 2015-02-01 MED ORDER — FENTANYL CITRATE (PF) 250 MCG/5ML IJ SOLN
INTRAMUSCULAR | Status: AC
  Filled 2015-02-01: qty 5

## 2015-02-01 MED ORDER — PROPOFOL 10 MG/ML IV BOLUS
INTRAVENOUS | Status: AC
  Filled 2015-02-01: qty 40

## 2015-02-01 MED ORDER — SCOPOLAMINE 1 MG/3DAYS TD PT72
MEDICATED_PATCH | TRANSDERMAL | Status: AC
  Filled 2015-02-01: qty 1

## 2015-02-01 MED ORDER — CITRIC ACID-SODIUM CITRATE 334-500 MG/5ML PO SOLN
30.0000 mL | ORAL | Status: AC
  Administered 2015-02-01: 30 mL via ORAL

## 2015-02-01 MED ORDER — SCOPOLAMINE 1 MG/3DAYS TD PT72
1.0000 | MEDICATED_PATCH | TRANSDERMAL | Status: DC
  Administered 2015-02-01: 1.5 mg via TRANSDERMAL

## 2015-02-01 MED ORDER — LACTATED RINGERS IV SOLN
INTRAVENOUS | Status: DC
  Administered 2015-02-01 (×2): via INTRAVENOUS

## 2015-02-01 MED ORDER — ONDANSETRON HCL 4 MG/2ML IJ SOLN
INTRAMUSCULAR | Status: AC
Start: 2015-02-01 — End: 2015-02-01
  Filled 2015-02-01: qty 2

## 2015-02-01 SURGICAL SUPPLY — 12 items
CLOTH BEACON ORANGE TIMEOUT ST (SAFETY) ×3 IMPLANT
GLOVE BIO SURGEON STRL SZ 6.5 (GLOVE) ×2 IMPLANT
GLOVE BIO SURGEONS STRL SZ 6.5 (GLOVE) ×1
GLOVE BIOGEL PI IND STRL 7.0 (GLOVE) ×3 IMPLANT
GLOVE BIOGEL PI INDICATOR 7.0 (GLOVE) ×6
GLOVE ECLIPSE 7.0 STRL STRAW (GLOVE) ×3 IMPLANT
GOWN STRL REUS W/TWL LRG LVL3 (GOWN DISPOSABLE) ×6 IMPLANT
NDL SPNL 22GX3.5 QUINCKE BK (NEEDLE) IMPLANT
NEEDLE SPNL 22GX3.5 QUINCKE BK (NEEDLE) IMPLANT
PACK VAGINAL MINOR WOMEN LF (CUSTOM PROCEDURE TRAY) ×3 IMPLANT
TOWEL OR 17X24 6PK STRL BLUE (TOWEL DISPOSABLE) ×6 IMPLANT
mirena ×2 IMPLANT

## 2015-02-01 NOTE — Progress Notes (Signed)
PROCEDURE DATE: 01/14/2014  PREOPERATIVE DIAGNOSES: Embedded IUD, failed attempts of in office removal, desires replacement POSTOPERATIVE DIAGNOSES: The same PROCEDURE: IUD removal under anesthesia and replacement SURGEON: Dr. Jaynie Collins ANESTHESIOLOGIST: Dr. Cristela Blue  INDICATIONS: 40 y.o. [id number] here for IUD removal under anesthesia secondary to failed in office IUD removal attempt.  Also desires reinsertion of new IUD. Discussed risks of irregular bleeding, cramping, infection, malpositioning or misplacement of the IUD outside the uterus which may require further procedures. Also advised to use backup contraception for one week as the risk of pregnancy is higher during the transition period of removing an IUD and replacing it with another one. The patient concurred with the proposed plan, giving informed written consent for the surgery  FINDINGS: Small uterus, nulliparous cervix. IUD removed easily with polyp forceps after prepping cervix. New Mirena inserted and strings cut to 2 cm.  ANESTHESIA: MAC ESTIMATED BLOOD LOSS: None COMPLICATIONS: None immediate  PROCEDURE IN DETAIL: The patient was then taken to the operating room where MAC anesthesia was administered and was found to be adequate. She was placed in the dorsal lithotomy position, and was prepped and draped in a sterile manner. After an adequate timeout was performed, attention was turned to her pelvis where a speculum was placed in the vagina.  The cervix was visualized and grasped anteriorly with a single tooth tenaculum.  Local analgesia was administered.  The strings of the IUD were not visualized, so polyp forceps were introduced into the endometrium. The IUD was grasped and removed in its entirety.  The new Mirena IUD insertion apparatus was used to sound the uterus to 9 cm;  the IUD was then placed per manufacturer's recommendations. Strings trimmed to 2 cm. Tenaculum was removed, good hemostasis noted. Patient  tolerated procedure well.   Patient was given post-procedure instructions.   She was reminded to have backup contraception for one week during this transition period between IUDs.  Patient was also asked to check IUD strings periodically and follow up in 4 weeks for IUD check.  The patient will be discharged to home as per PACU criteria.   Jaynie Collins, MD, FACOG Attending Obstetrician & Gynecologist, [facility]

## 2015-02-01 NOTE — H&P (Signed)
Preoperative History and Physical  Robin Rios is a 40 y.o. [id number] here for IUD removal and reinsertion s/p failed attempt in office.   No significant preoperative concerns.  Proposed surgery: Mirena IUD removal and reinsertion  Past Medical History  Diagnosis Date  . Medical history non-contributory   . Vaginal delivery 1999, 2005, 2007   Past Surgical History  Procedure Laterality Date  . No past surgeries     OB History  Gravida Para Term Preterm AB SAB TAB Ectopic Multiple Living  0 2 0 2 0 0 3    # Outcome Date GA Lbr Len/2nd Weight Sex Delivery Anes PTL Lv  5 TAB           4 TAB           3 Term           2 Term           1 Term             Patient denies any other pertinent gynecologic issues.   No current facility-administered medications on file prior to encounter.   Current Outpatient Prescriptions on File Prior to Encounter  Medication Sig Dispense Refill  . metroNIDAZOLE (FLAGYL) 500 MG tablet Take 1 tablet (500 mg total) by mouth 2 (two) times daily. (Patient not taking: Reported on 01/20/2015) 14 tablet 0   No Known Allergies  Social History:   reports that she has never smoked. She has never used smokeless tobacco. She reports that she uses illicit drugs (Marijuana). She reports that she does not drink alcohol.  History reviewed. No pertinent family history.  Review of Systems: Noncontributory  PHYSICAL EXAM: Blood pressure 114/78, pulse 66, temperature 97.9 F (36.6 C), temperature source Oral, resp. rate 18, height  (1.727 m), weight 152 lb (68.947 kg), SpO2 100 %. CONSTITUTIONAL: Well-developed, well-nourished female in no acute distress.  HENT:  Normocephalic, atraumatic, External right and left ear normal. Oropharynx is clear and moist EYES: Conjunctivae and EOM are normal. Pupils are equal, round, and reactive to light. No scleral icterus.  NECK: Normal range of motion, supple, no masses SKIN: Skin is warm and dry. No rash  noted. Not diaphoretic. No erythema. No pallor. NEUROLGIC: Alert and oriented to person, place, and time. Normal reflexes, muscle tone coordination. No cranial nerve deficit noted. PSYCHIATRIC: Normal mood and affect. Normal behavior. Normal judgment and thought content. CARDIOVASCULAR: Normal heart rate noted, regular rhythm RESPIRATORY: Effort and breath sounds normal, no problems with respiration noted ABDOMEN: Soft, nontender, nondistended. PELVIC: Deferred MUSCULOSKELETAL: Normal range of motion. No edema and no tenderness. 2+ distal pulses.  Labs: Results for orders placed or performed during the hospital encounter of 02/01/15 (from the past 336 hour(s))  CBC   Collection Time: 02/01/15  9:08 AM  Result Value Ref Range   WBC 8.1 4.0 - 10.5 K/uL   RBC 4.44 3.87 - 5.11 MIL/uL   Hemoglobin 14.5 12.0 - 15.0 g/dL   HCT 47.4 25.9 - 56.3 %   MCV 95.9 78.0 - 100.0 fL   MCH 32.7 26.0 - 34.0 pg   MCHC 34.0 30.0 - 36.0 g/dL   RDW 87.5 64.3 - 32.9 %   Platelets 209 150 - 400 K/uL  Pregnancy, urine   Collection Time: 02/01/15  9:08 AM  Result Value Ref Range   Preg Test, Ur NEGATIVE NEGATIVE    Imaging Studies: No results found.  Assessment: Patient Active Problem List  Diagnosis Date Noted  . Encounter for IUD removal and reinsertion 02/01/2015    Plan: Patient will undergo Mirena IUD removal and reinsertion.   Discussed risks of irregular bleeding, cramping, infection, malpositioning or misplacement of the IUD outside the uterus which may require further procedures. Also advised to use backup contraception for one week as the risk of pregnancy is higher during the transition period of removing an IUD and replacing it with another one.  The patient concurred with the proposed plan, giving informed written consent for the surgery.  Patient has been NPO since last night she will remain NPO for procedure.  Anesthesia and OR aware.  To OR when ready.  Jaynie Collins, M.D. 02/01/2015  11:37 AM

## 2015-02-01 NOTE — Op Note (Signed)
PROCEDURE DATE: 01/14/2014  PREOPERATIVE DIAGNOSES: Embedded IUD, failed attempts of in office removal, desires replacement POSTOPERATIVE DIAGNOSES: The same PROCEDURE: IUD removal under anesthesia and replacement SURGEON: Dr. Khrystina Bonnes ANESTHESIOLOGIST: Dr. Kyle Jackson  INDICATIONS: 40 y.o. [id number] here for IUD removal under anesthesia secondary to failed in office IUD removal attempt.  Also desires reinsertion of new IUD. Discussed risks of irregular bleeding, cramping, infection, malpositioning or misplacement of the IUD outside the uterus which may require further procedures. Also advised to use backup contraception for one week as the risk of pregnancy is higher during the transition period of removing an IUD and replacing it with another one. The patient concurred with the proposed plan, giving informed written consent for the surgery  FINDINGS: Small uterus, nulliparous cervix. IUD removed easily with polyp forceps after prepping cervix. New Mirena inserted and strings cut to 2 cm.  ANESTHESIA: MAC ESTIMATED BLOOD LOSS: None COMPLICATIONS: None immediate  PROCEDURE IN DETAIL: The patient was then taken to the operating room where MAC anesthesia was administered and was found to be adequate. She was placed in the dorsal lithotomy position, and was prepped and draped in a sterile manner. After an adequate timeout was performed, attention was turned to her pelvis where a speculum was placed in the vagina.  The cervix was visualized and grasped anteriorly with a single tooth tenaculum.  Local analgesia was administered.  The strings of the IUD were not visualized, so polyp forceps were introduced into the endometrium. The IUD was grasped and removed in its entirety.  The new Mirena IUD insertion apparatus was used to sound the uterus to 9 cm;  the IUD was then placed per manufacturer's recommendations. Strings trimmed to 2 cm. Tenaculum was removed, good hemostasis noted. Patient  tolerated procedure well.   Patient was given post-procedure instructions.   She was reminded to have backup contraception for one week during this transition period between IUDs.  Patient was also asked to check IUD strings periodically and follow up in 4 weeks for IUD check.  The patient will be discharged to home as per PACU criteria.   Abdurahman Rugg, MD, FACOG Attending Obstetrician & Gynecologist, [facility] 

## 2015-02-01 NOTE — Discharge Instructions (Signed)

## 2015-02-01 NOTE — Transfer of Care (Signed)
Immediate Anesthesia Transfer of Care Note  Patient: Robin Rios  Procedure(s) Performed: Procedure(s): INTRAUTERINE DEVICE (IUD) REMOVAL and placement of Intruterine device (N/A)  Patient Location: PACU  Anesthesia Type:MAC  Level of Consciousness: awake, alert  and oriented  Airway & Oxygen Therapy: Patient Spontanous Breathing  Post-op Assessment: Report given to RN and Post -op Vital signs reviewed and stable  Post vital signs: Reviewed and stable  Last Vitals:  Filed Vitals:   02/01/15 0944  BP: 114/78  Pulse: 66  Temp: 36.6 C  Resp: 18    Complications: No apparent anesthesia complications

## 2015-02-03 ENCOUNTER — Encounter (HOSPITAL_COMMUNITY): Payer: Self-pay | Admitting: Obstetrics & Gynecology

## 2015-02-03 NOTE — Anesthesia Postprocedure Evaluation (Signed)
Anesthesia Post Note  Patient: Robin Rios  Procedure(s) Performed: Procedure(s) (LRB): INTRAUTERINE DEVICE (IUD) REMOVAL and placement of Intruterine device (N/A)  Patient location during evaluation: PACU Anesthesia Type: MAC Level of consciousness: awake and alert Pain management: pain level controlled Vital Signs Assessment: post-procedure vital signs reviewed and stable Respiratory status: spontaneous breathing, nonlabored ventilation, respiratory function stable and patient connected to nasal cannula oxygen Cardiovascular status: stable and blood pressure returned to baseline Anesthetic complications: no    Last Vitals:  Filed Vitals:   02/01/15 1315 02/01/15 1355  BP: 104/57 106/68  Pulse: 54 52  Temp: 36.7 C 36.7 C  Resp: 13 16    Last Pain: There were no vitals filed for this visit.               Jiles Garter

## 2015-02-03 NOTE — Anesthesia Preprocedure Evaluation (Signed)

## 2015-03-02 ENCOUNTER — Ambulatory Visit: Payer: Medicaid Other | Admitting: Obstetrics & Gynecology

## 2016-05-30 ENCOUNTER — Ambulatory Visit (INDEPENDENT_AMBULATORY_CARE_PROVIDER_SITE_OTHER): Payer: Self-pay | Admitting: Internal Medicine

## 2016-05-30 ENCOUNTER — Encounter: Payer: Self-pay | Admitting: Internal Medicine

## 2016-05-30 VITALS — BP 102/72 | HR 64 | Resp 12 | Ht 65.5 in | Wt 164.0 lb

## 2016-05-30 DIAGNOSIS — K029 Dental caries, unspecified: Secondary | ICD-10-CM

## 2016-05-30 DIAGNOSIS — J301 Allergic rhinitis due to pollen: Secondary | ICD-10-CM

## 2016-05-30 DIAGNOSIS — H269 Unspecified cataract: Secondary | ICD-10-CM

## 2016-05-30 DIAGNOSIS — Z23 Encounter for immunization: Secondary | ICD-10-CM

## 2016-05-30 MED ORDER — CETIRIZINE HCL 10 MG PO TABS: 10.0000 mg | ORAL_TABLET | Freq: Every day | ORAL | 11 refills | Status: AC

## 2016-05-30 NOTE — Patient Instructions (Signed)
Can google "advance directives, [place]"  And bring up form from Secretary of State. Print and fill out Or can go to "5 wishes"  Which is also in Spanish and fill out--this costs $5--perhaps easier to use. Designate a Medical Power of Attorney to speak for you if you are unable to speak for yourself when ill or injured  
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
## 2016-07-10 ENCOUNTER — Ambulatory Visit: Payer: Self-pay | Admitting: Internal Medicine

## 2016-08-29 ENCOUNTER — Encounter: Payer: Self-pay | Admitting: Internal Medicine

## 2016-08-29 ENCOUNTER — Ambulatory Visit (INDEPENDENT_AMBULATORY_CARE_PROVIDER_SITE_OTHER): Payer: Self-pay | Admitting: Internal Medicine

## 2016-08-29 VITALS — BP 122/80 | HR 68 | Resp 12 | Ht 66.25 in | Wt 166.0 lb

## 2016-08-29 DIAGNOSIS — Z124 Encounter for screening for malignant neoplasm of cervix: Secondary | ICD-10-CM

## 2016-08-29 DIAGNOSIS — E663 Overweight: Secondary | ICD-10-CM | POA: Insufficient documentation

## 2016-08-29 DIAGNOSIS — Z0000 Encounter for general adult medical examination without abnormal findings: Secondary | ICD-10-CM

## 2016-08-29 DIAGNOSIS — Z1231 Encounter for screening mammogram for malignant neoplasm of breast: Secondary | ICD-10-CM

## 2016-08-29 DIAGNOSIS — Z1239 Encounter for other screening for malignant neoplasm of breast: Secondary | ICD-10-CM

## 2016-08-29 DIAGNOSIS — N898 Other specified noninflammatory disorders of vagina: Secondary | ICD-10-CM

## 2016-08-29 DIAGNOSIS — A599 Trichomoniasis, unspecified: Secondary | ICD-10-CM

## 2016-08-29 LAB — POCT WET PREP WITH KOH
CLUE CELLS WET PREP PER HPF POC: NEGATIVE
KOH PREP POC: NEGATIVE
RBC WET PREP PER HPF POC: NEGATIVE
Trichomonas, UA: POSITIVE
YEAST WET PREP PER HPF POC: NEGATIVE

## 2016-08-29 MED ORDER — CALCIUM CITRATE-VITAMIN D 250-100 MG-UNIT PO TABS: ORAL_TABLET | ORAL | Status: AC

## 2016-08-29 MED ORDER — METRONIDAZOLE 500 MG PO TABS: ORAL_TABLET | ORAL | 0 refills | Status: AC
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
## 2016-08-29 NOTE — Patient Instructions (Signed)
Can google "advance directives, [place]"  And bring up form from Secretary of State. Print and fill out Or can go to "5 wishes"  Which is also in Spanish and fill out--this costs $5--perhaps easier to use. Designate a Medical Power of Attorney to speak for you if you are unable to speak for yourself when ill or injured  

## 2016-08-31 LAB — GC/CHLAMYDIA PROBE AMP
CHLAMYDIA, DNA PROBE: NEGATIVE
NEISSERIA GONORRHOEAE BY PCR: NEGATIVE

## 2016-08-31 LAB — CYTOLOGY - PAP

## 2016-09-07 ENCOUNTER — Other Ambulatory Visit (INDEPENDENT_AMBULATORY_CARE_PROVIDER_SITE_OTHER): Payer: Self-pay

## 2016-09-07 DIAGNOSIS — Z0000 Encounter for general adult medical examination without abnormal findings: Secondary | ICD-10-CM

## 2016-09-07 DIAGNOSIS — Z7251 High risk heterosexual behavior: Secondary | ICD-10-CM

## 2016-09-08 LAB — COMPREHENSIVE METABOLIC PANEL
ALT: 11 IU/L (ref 0–32)
AST: 15 IU/L (ref 0–40)
Albumin/Globulin Ratio: 1.7 (ref 1.2–2.2)
Albumin: 4.3 g/dL (ref 3.5–5.5)
Alkaline Phosphatase: 57 IU/L (ref 39–117)
BUN/Creatinine Ratio: 12 (ref 9–23)
BUN: 10 mg/dL (ref 6–24)
Bilirubin Total: 0.4 mg/dL (ref 0.0–1.2)
CALCIUM: 9.1 mg/dL (ref 8.7–10.2)
CO2: 25 mmol/L (ref 20–29)
CREATININE: 0.84 mg/dL (ref 0.57–1.00)
Chloride: 100 mmol/L (ref 96–106)
GFR, EST AFRICAN AMERICAN: 99 mL/min/{1.73_m2} (ref >59)
GFR, EST NON AFRICAN AMERICAN: 86 mL/min/{1.73_m2} (ref >59)
Globulin, Total: 2.6 g/dL (ref 1.5–4.5)
Glucose: 97 mg/dL (ref 65–99)
Potassium: 3.7 mmol/L (ref 3.5–5.2)
SODIUM: 142 mmol/L (ref 134–144)
TOTAL PROTEIN: 6.9 g/dL (ref 6.0–8.5)

## 2016-09-08 LAB — RPR QUALITATIVE: RPR Ser Ql: NONREACTIVE

## 2016-09-08 LAB — CBC WITH DIFFERENTIAL/PLATELET
BASOS: 0 %
Basophils Absolute: 0 10*3/uL (ref 0.0–0.2)
EOS (ABSOLUTE): 0 10*3/uL (ref 0.0–0.4)
EOS: 1 %
HEMATOCRIT: 42 % (ref 34.0–46.6)
Hemoglobin: 13.7 g/dL (ref 11.1–15.9)
IMMATURE GRANS (ABS): 0 10*3/uL (ref 0.0–0.1)
IMMATURE GRANULOCYTES: 0 %
LYMPHS: 30 %
Lymphocytes Absolute: 2.1 10*3/uL (ref 0.7–3.1)
MCH: 32 pg (ref 26.6–33.0)
MCHC: 32.6 g/dL (ref 31.5–35.7)
MCV: 98 fL — AB (ref 79–97)
MONOS ABS: 0.4 10*3/uL (ref 0.1–0.9)
Monocytes: 6 %
NEUTROS PCT: 63 %
Neutrophils Absolute: 4.2 10*3/uL (ref 1.4–7.0)
Platelets: 254 10*3/uL (ref 150–379)
RBC: 4.28 x10E6/uL (ref 3.77–5.28)
RDW: 13.5 % (ref 12.3–15.4)
WBC: 6.8 10*3/uL (ref 3.4–10.8)

## 2016-09-08 LAB — LIPID PANEL W/O CHOL/HDL RATIO
Cholesterol, Total: 192 mg/dL (ref 100–199)
HDL: 73 mg/dL (ref >39)
LDL CALC: 110 mg/dL — AB (ref 0–99)
Triglycerides: 47 mg/dL (ref 0–149)
VLDL CHOLESTEROL CAL: 9 mg/dL (ref 5–40)

## 2016-09-08 LAB — HIV ANTIBODY (ROUTINE TESTING W REFLEX): HIV SCREEN 4TH GENERATION: NONREACTIVE

## 2016-10-17 ENCOUNTER — Other Ambulatory Visit: Payer: Self-pay | Admitting: Internal Medicine

## 2016-10-17 DIAGNOSIS — Z1231 Encounter for screening mammogram for malignant neoplasm of breast: Secondary | ICD-10-CM

## 2016-10-18 ENCOUNTER — Ambulatory Visit
Admission: RE | Admit: 2016-10-18 | Discharge: 2016-10-18 | Disposition: A | Discharge status: Home / Self Care | Payer: Self-pay | Source: Ambulatory Visit | Attending: Internal Medicine | Admitting: Internal Medicine

## 2016-10-18 DIAGNOSIS — Z1231 Encounter for screening mammogram for malignant neoplasm of breast: Secondary | ICD-10-CM

## 2019-07-25 ENCOUNTER — Other Ambulatory Visit: Payer: Self-pay

## 2019-07-25 ENCOUNTER — Ambulatory Visit (HOSPITAL_COMMUNITY)
Admission: EM | Admit: 2019-07-25 | Discharge: 2019-07-25 | Disposition: A | Discharge status: Home / Self Care | Payer: No Typology Code available for payment source | Attending: Physician Assistant | Admitting: Physician Assistant

## 2019-07-25 ENCOUNTER — Encounter (HOSPITAL_COMMUNITY): Payer: Self-pay

## 2019-07-25 DIAGNOSIS — M62830 Muscle spasm of back: Secondary | ICD-10-CM

## 2019-07-25 MED ORDER — DICLOFENAC SODIUM 1 % EX GEL: 2.0000 g | Freq: Four times a day (QID) | CUTANEOUS | 0 refills | Status: AC

## 2019-07-25 MED ORDER — IBUPROFEN 800 MG PO TABS: 800.0000 mg | ORAL_TABLET | Freq: Three times a day (TID) | ORAL | 0 refills | Status: AC

## 2019-07-25 MED ORDER — TIZANIDINE HCL 4 MG PO TABS: 4.0000 mg | ORAL_TABLET | Freq: Four times a day (QID) | ORAL | 0 refills | Status: AC | PRN

## 2019-07-25 NOTE — ED Provider Notes (Signed)
[facility]    CSN: [id number] Arrival date & time: 07/25/19  1212      History   Chief Complaint Chief Complaint  Patient presents with  . Back Pain    HPI Robin Rios is a 44 y.o. female.   Patient reports urgent care for left-sided back pain that radiates into her left axilla. She reports this started a few days ago but acutely worsened yesterday. She reports the pain was very severe and put her into yesterday. She tried other peoples gabapentin, muscle relaxer and hydrocodone today. Hydrocodone help with the pain. She denies radiation into the chest however it moves into the axilla just below her breast. She denies shortness of breath however the pain does take her breath at times. She does report some increase in the pain in her back with deep breath. She states it is difficult to qualify the pain. She reports the pain is not very significant in clinic today given pain medicine. No chest pain or shortness of breath in clinic. No trauma or falls. No rashes. No history of lower leg swelling.     Past Medical History:  Diagnosis Date  . Vaginal delivery 1999, 2005, 2007    Patient Active Problem List   Diagnosis Date Noted  . Overweight 08/29/2016  . Encounter for IUD removal and reinsertion 02/01/2015    Past Surgical History:  Procedure Laterality Date  . IUD REMOVAL N/A 02/01/2015   Procedure: INTRAUTERINE DEVICE (IUD) REMOVAL and placement of Intruterine device;  Surgeon: Tereso Newcomer, MD;  Location: [facility];  Service: Gynecology;  Laterality: N/A;    OB History    Gravida  5   Para  3   Term  3   Preterm  0   AB  2   Living  3     SAB  0   TAB  2   Ectopic  0   Multiple  0   Live Births               Home Medications    Prior to Admission medications   Medication Sig Start Date End Date Taking? Authorizing Provider  calcium-vitamin D 250-100 MG-UNIT tablet 2 tabs by mouth twice daily 08/29/16   Julieanne Manson,  MD  cetirizine (ZYRTEC) 10 MG tablet Take 1 tablet (10 mg total) by mouth daily. 05/30/16   Julieanne Manson, MD  diclofenac Sodium (VOLTAREN) 1 % GEL Apply 2 g topically 4 (four) times daily. 07/25/19   Courtlyn Aki, Veryl Speak, PA-C  ibuprofen (ADVIL) 800 MG tablet Take 1 tablet (800 mg total) by mouth 3 (three) times daily. 07/25/19   Courney Garrod, Veryl Speak, PA-C  levonorgestrel (MIRENA) 20 MCG/24HR IUD 1 each by Intrauterine route once.     [provider]  metroNIDAZOLE (FLAGYL) 500 MG tablet 4 tabs by mouth for one dose 08/29/16   Julieanne Manson, MD  tiZANidine (ZANAFLEX) 4 MG tablet Take 1 tablet (4 mg total) by mouth every 6 (six) hours as needed for muscle spasms. 07/25/19   Selenne Coggin, Veryl Speak, PA-C    Family History Family History  Problem Relation Age of Onset  . Hypertension Mother   . Allergies Mother   . Spina bifida Daughter        Lipomyelomenigocele:  able to walk. Urinary incontinence associated.  Imperforate anus, uterine anomalies, one kidney-left only  . Club foot Daughter        with leg length discrepancy  . Asthma Son   .  Allergies Son   . Eczema Son   . Endometriosis Sister     Social History Social History   Tobacco Use  . Smoking status: Never Smoker  . Smokeless tobacco: Never Used  Vaping Use  . Vaping Use: Never used  Substance Use Topics  . Alcohol use: Yes    Comment: occasionally  . Drug use: Yes    Types: Marijuana    Comment: weekend use.     Allergies   Patient has no known allergies.   Review of Systems Review of Systems   Physical Exam Triage Vital Signs ED Triage Vitals  Enc Vitals Group     BP 07/25/19 1239 107/81     Pulse Rate 07/25/19 1239 70     Resp 07/25/19 1239 18     Temp 07/25/19 1239 98.7 F (37.1 C)     Temp Source 07/25/19 1239 Oral     SpO2 07/25/19 1239 100 %     Weight --      Height --      Head Circumference --      Peak Flow --      Pain Score 07/25/19 1237 10     Pain Loc --      Pain Edu? --      Excl.  in GC? --    No data found.  Updated Vital Signs BP 107/81 (BP Location: Right Arm)   Pulse 70   Temp 98.7 F (37.1 C) (Oral)   Resp 18   SpO2 100%   Visual Acuity Right Eye Distance:   Left Eye Distance:   Bilateral Distance:    Right Eye Near:   Left Eye Near:    Bilateral Near:     Physical Exam Vitals and nursing note reviewed.  Constitutional:      General: She is not in acute distress.    Appearance: She is well-developed.  HENT:     Head: Normocephalic and atraumatic.  Eyes:     Conjunctiva/sclera: Conjunctivae normal.  Cardiovascular:     Rate and Rhythm: Normal rate and regular rhythm.     Heart sounds: No murmur heard.   Pulmonary:     Effort: Pulmonary effort is normal. No respiratory distress.     Breath sounds: Normal breath sounds. No wheezing, rhonchi or rales.     Comments: Equal expansion of the chest bilaterally. Abdominal:     Palpations: Abdomen is soft.     Tenderness: There is no abdominal tenderness.  Musculoskeletal:     Cervical back: Neck supple.     Right lower leg: No edema.     Left lower leg: No edema.     Comments: Tenderness and spasm appreciated in the left upper thoracic paraspinal musculature between vertebral bodies and left scapula. Pain elicited with twisting motion of the spine and with palpation. No midline spinal tenderness.  Patient is ambulatory moving all limbs appropriately.  Skin:    General: Skin is warm and dry.     Findings: No rash.  Neurological:     General: No focal deficit present.     Mental Status: She is alert and oriented to person, place, and time.     Gait: Gait normal.      UC Treatments / Results  Labs (all labs ordered are listed, but only abnormal results are displayed) Labs Reviewed - No data to display  EKG Sinus bradycardia. Normal EKG.  Radiology No results found.  Procedures Procedures (including critical care time)  Medications Ordered in UC Medications - No data to  display  Initial Impression / Assessment and Plan / UC Course  I have reviewed the triage vital signs and the nursing notes.  Pertinent labs & imaging results that were available during my care of the patient were reviewed by me and considered in my medical decision making (see chart for details).     #Thoracic back spasm Patient is a 44 year old presenting with thoracic back spasm. Doubt cardiac given normal EKG without evidence of ST elevation acute findings, reproducibility. Low suspicion for pulmonary etiology with normal lung exam no other risk factors for PE. Believe this is in the musculature given reproducibility. We'll treat accordingly. NSAID and muscle relaxer. Patient to follow-up with primary care. Strict emergency department precautions were discussed. Patient verbalized understanding plan of care. 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 Provider   ibuprofen (ADVIL) 800 MG tablet Take 1 tablet (800 mg total) by mouth 3 (three) times daily. 21 tablet Mikiah Demond, Veryl Speak, PA-C   tiZANidine (ZANAFLEX) 4 MG tablet Take 1 tablet (4 mg total) by mouth every 6 (six) hours as needed for muscle spasms. 30 tablet Purity Irmen, Veryl Speak, PA-C   diclofenac Sodium (VOLTAREN) 1 % GEL Apply 2 g topically 4 (four) times daily. 50 g Denine Brotz, Veryl Speak, PA-C     I have reviewed the PDMP during this encounter.   Hermelinda Medicus, PA-C 07/25/19  1424

## 2019-07-25 NOTE — Discharge Instructions (Signed)
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

## 2019-07-25 NOTE — ED Triage Notes (Addendum)
Pt presents with pain under left scapula radiates under left breast x 4 daysPain improve when lay down, worse when sitting.  Denies trauma, chest pain, abdominal pain. Pt took hydrocone 2 hrs ago approx.

## 2021-04-29 ENCOUNTER — Encounter (HOSPITAL_COMMUNITY): Payer: Self-pay

## 2021-04-29 ENCOUNTER — Emergency Department (HOSPITAL_COMMUNITY)
Admission: EM | Admit: 2021-04-29 | Discharge: 2021-04-30 | Disposition: A | Discharge status: Home / Self Care | Payer: 59 | Attending: Emergency Medicine | Admitting: Emergency Medicine

## 2021-04-29 ENCOUNTER — Other Ambulatory Visit: Payer: Self-pay

## 2021-04-29 ENCOUNTER — Emergency Department (HOSPITAL_COMMUNITY): Payer: 59

## 2021-04-29 DIAGNOSIS — I252 Old myocardial infarction: Secondary | ICD-10-CM | POA: Diagnosis not present

## 2021-04-29 DIAGNOSIS — R079 Chest pain, unspecified: Secondary | ICD-10-CM | POA: Diagnosis not present

## 2021-04-29 DIAGNOSIS — Z86718 Personal history of other venous thrombosis and embolism: Secondary | ICD-10-CM | POA: Insufficient documentation

## 2021-04-29 DIAGNOSIS — Z859 Personal history of malignant neoplasm, unspecified: Secondary | ICD-10-CM | POA: Insufficient documentation

## 2021-04-29 LAB — CBC WITH DIFFERENTIAL/PLATELET
Abs Immature Granulocytes: 0.02 10*3/uL (ref 0.00–0.07)
Basophils Absolute: 0 10*3/uL (ref 0.0–0.1)
Basophils Relative: 0 %
Eosinophils Absolute: 0.1 10*3/uL (ref 0.0–0.5)
Eosinophils Relative: 1 %
HCT: 41.3 % (ref 36.0–46.0)
Hemoglobin: 13.6 g/dL (ref 12.0–15.0)
Immature Granulocytes: 0 %
Lymphocytes Relative: 26 %
Lymphs Abs: 2.5 10*3/uL (ref 0.7–4.0)
MCH: 31.3 pg (ref 26.0–34.0)
MCHC: 32.9 g/dL (ref 30.0–36.0)
MCV: 95.2 fL (ref 80.0–100.0)
Monocytes Absolute: 0.6 10*3/uL (ref 0.1–1.0)
Monocytes Relative: 6 %
Neutro Abs: 6.4 10*3/uL (ref 1.7–7.7)
Neutrophils Relative %: 67 %
Platelets: 243 10*3/uL (ref 150–400)
RBC: 4.34 MIL/uL (ref 3.87–5.11)
RDW: 12.6 % (ref 11.5–15.5)
WBC: 9.6 10*3/uL (ref 4.0–10.5)
nRBC: 0 % (ref 0.0–0.2)

## 2021-04-29 LAB — BASIC METABOLIC PANEL
Anion gap: 7 (ref 5–15)
BUN: 10 mg/dL (ref 6–20)
CO2: 26 mmol/L (ref 22–32)
Calcium: 11.4 mg/dL — ABNORMAL HIGH (ref 8.9–10.3)
Chloride: 105 mmol/L (ref 98–111)
Creatinine, Ser: 0.94 mg/dL (ref 0.44–1.00)
GFR, Estimated: >60 mL/min (ref >60)
Glucose, Bld: 94 mg/dL (ref 70–99)
Potassium: 3.5 mmol/L (ref 3.5–5.1)
Sodium: 138 mmol/L (ref 135–145)

## 2021-04-29 LAB — TROPONIN I (HIGH SENSITIVITY): Troponin I (High Sensitivity): 5 ng/L (ref <18)

## 2021-04-29 LAB — I-STAT BETA HCG BLOOD, ED (MC, WL, AP ONLY): I-stat hCG, quantitative: <5 m[IU]/mL (ref <5)

## 2021-04-29 MED ORDER — MORPHINE SULFATE (PF) 4 MG/ML IV SOLN
4.0000 mg | Freq: Once | INTRAVENOUS | Status: AC
  Administered 2021-04-30: 4 mg via INTRAVENOUS
  Filled 2021-04-29: qty 1

## 2021-04-29 MED ORDER — ONDANSETRON HCL 4 MG/2ML IJ SOLN
4.0000 mg | Freq: Once | INTRAMUSCULAR | Status: AC
  Administered 2021-04-30: 4 mg via INTRAVENOUS
  Filled 2021-04-29: qty 2

## 2021-04-29 NOTE — ED Provider Triage Note (Signed)
Emergency Medicine Provider Triage Evaluation Note ? ?Robin Rios , a 46 y.o. female  was evaluated in triage.  Pt complains of gradual onset, constant, achy, L sided back pain with radiation into her left chest that began yesterday. She also complains of SOB. She does report she took an Ibuprofen and muscle relaxer earlier today and vomited afterwards - she is unsure if she vomited from same or from the chest pain. Pt is a never smoker. No hx of CAD or Fhx.  ? ?Review of Systems  ?Positive: + chest pain, SOB, nausea, vomiting ?Negative: - diaphoresis ? ?Physical Exam  ?BP (!) 138/92 (BP Location: Right Arm)   Pulse 77   Temp 98 ?F (36.7 ?C) (Oral)   Resp 20   SpO2 98%  ?Gen:   Awake, no distress   ?Resp:  Normal effort  ?MSK:   Moves extremities without difficulty ?Other:   ? ?Medical Decision Making  ?Medically screening exam initiated at 10:49 PM.  Appropriate orders placed.  Robin Rios was informed that the remainder of the evaluation will be completed by another provider, this initial triage assessment does not replace that evaluation, and the importance of remaining in the ED until their evaluation is complete. ? ? ?  ?Eustaquio Maize, PA-C ?04/29/21 2252 ? ?

## 2021-04-29 NOTE — ED Triage Notes (Signed)
Pt complains of gradual onset, constant, achy, L sided back pain with radiation into her left chest that began yesterday. She also complains of SOB. She does report she took an Ibuprofen and muscle relaxer earlier today and vomited afterwards - she is unsure if she vomited from same or from the chest pain. Pt is a never smoker. No hx of CAD or Fhx.  ?

## 2021-04-29 NOTE — ED Notes (Signed)
Pt going to room from x-ray. ?

## 2021-04-30 LAB — TROPONIN I (HIGH SENSITIVITY): Troponin I (High Sensitivity): 5 ng/L (ref <18)

## 2021-04-30 LAB — D-DIMER, QUANTITATIVE: D-Dimer, Quant: <0.27 ug/mL-FEU (ref 0.00–0.50)

## 2021-04-30 MED ORDER — METHOCARBAMOL 500 MG PO TABS: 500.0000 mg | ORAL_TABLET | Freq: Three times a day (TID) | ORAL | 0 refills | Status: AC | PRN

## 2021-04-30 MED ORDER — LIDOCAINE 5 % EX PTCH: 1.0000 | MEDICATED_PATCH | Freq: Every day | CUTANEOUS | 0 refills | Status: AC | PRN

## 2021-04-30 MED ORDER — ONDANSETRON 4 MG PO TBDP: 4.0000 mg | ORAL_TABLET | Freq: Three times a day (TID) | ORAL | 0 refills | Status: AC | PRN

## 2021-04-30 MED ORDER — NAPROXEN 500 MG PO TABS: 500.0000 mg | ORAL_TABLET | Freq: Two times a day (BID) | ORAL | 0 refills | Status: AC | PRN

## 2021-04-30 MED ORDER — KETOROLAC TROMETHAMINE 15 MG/ML IJ SOLN
15.0000 mg | Freq: Once | INTRAMUSCULAR | Status: AC
  Administered 2021-04-30: 15 mg via INTRAVENOUS
  Filled 2021-04-30: qty 1

## 2021-04-30 NOTE — ED Provider Notes (Signed)
?[facility] ?Provider Note ? ? ?CSN: [id number] ?Arrival date & time: 04/29/21  2135 ? ?  ? ?History ? ?Chief Complaint  ?Patient presents with  ? Back Pain  ? Chest Pain  ? ? ?Robin Rios is a 46 y.o. female who presents to the emergency department with complaints of chest pain since yesterday.  Patient states the pain is the left side of her chest and her upper back, wraps around, is aching in nature, worse with deep breaths, position changes, positions, and certain movements.  No alleviating factors.  Tried taking ibuprofen but vomited shortly after, no additional emesis.  Does not recall specific injury.  She does do some lifting at work.  Has had some associated shortness of breath.  She denies fever, cough,  diaphoresis, dizziness, syncope, leg pain/swelling, hemoptysis, recent surgery/trauma, recent long travel, hormone use, personal hx of cancer, or hx of DVT/PE.  ? ? ? ?HPI ? ?  ? ?Home Medications ?Prior to Admission medications   ?Medication Sig Start Date End Date Taking? Authorizing Provider  ?calcium-vitamin D 250-100 MG-UNIT tablet 2 tabs by mouth twice daily 08/29/16   Mack Hook, MD  ?cetirizine (ZYRTEC) 10 MG tablet Take 1 tablet (10 mg total) by mouth daily. 05/30/16   Mack Hook, MD  ?diclofenac Sodium (VOLTAREN) 1 % GEL Apply 2 g topically 4 (four) times daily. 07/25/19   Darr, Edison Nasuti, PA-C  ?ibuprofen (ADVIL) 800 MG tablet Take 1 tablet (800 mg total) by mouth 3 (three) times daily. 07/25/19   Darr, Edison Nasuti, PA-C  ?levonorgestrel (MIRENA) 20 MCG/24HR IUD 1 each by Intrauterine route once.     [provider]  ?metroNIDAZOLE (FLAGYL) 500 MG tablet 4 tabs by mouth for one dose 08/29/16   Mack Hook, MD  ?tiZANidine (ZANAFLEX) 4 MG tablet Take 1 tablet (4 mg total) by mouth every 6 (six) hours as needed for muscle spasms. 07/25/19   Darr, Edison Nasuti, PA-C  ?   ? ?Allergies    ?Patient has no known allergies.   ? ?Review of  Systems   ?Review of Systems  ?Constitutional:  Negative for chills, diaphoresis and fever.  ?Respiratory:  Positive for shortness of breath. Negative for cough.   ?Cardiovascular:  Positive for chest pain.  ?Gastrointestinal:  Positive for vomiting. Negative for abdominal pain and nausea.  ?Neurological:  Negative for syncope.  ?All other systems reviewed and are negative. ? ?Physical Exam ?Updated Vital Signs ?BP 116/64   Pulse 77   Temp 98 ?F (36.7 ?C) (Oral)   Resp (!) 31   SpO2 100%  ?Physical Exam ?Vitals and nursing note reviewed.  ?Constitutional:   ?   Appearance: She is well-developed. She is not toxic-appearing.  ?HENT:  ?   Head: Normocephalic and atraumatic.  ?Eyes:  ?   General:     ?   Right eye: No discharge.     ?   Left eye: No discharge.  ?   Conjunctiva/sclera: Conjunctivae normal.  ?Cardiovascular:  ?   Rate and Rhythm: Normal rate and regular rhythm.  ?   Pulses:     ?     Radial pulses are 2+ on the right side and 2+ on the left side.  ?Pulmonary:  ?   Effort: No respiratory distress.  ?   Breath sounds: Normal breath sounds. No wheezing or rales.  ?Abdominal:  ?   General: There is no distension.  ?   Palpations: Abdomen is soft.  ?  Tenderness: There is no abdominal tenderness.  ?Musculoskeletal:  ?   Cervical back: Neck supple.  ?   Right lower leg: No tenderness. No edema.  ?   Left lower leg: No tenderness. No edema.  ?Skin: ?   General: Skin is warm and dry.  ?Neurological:  ?   Mental Status: She is alert.  ?   Comments: Clear speech.   ?Psychiatric:     ?   Behavior: Behavior normal.  ? ? ?ED Results / Procedures / Treatments   ?Labs ?(all labs ordered are listed, but only abnormal results are displayed) ?Labs Reviewed  ?BASIC METABOLIC PANEL - Abnormal; Notable for the following components:  ?    Result Value  ? Calcium 11.4 (*)   ? All other components within normal limits  ?CBC WITH DIFFERENTIAL/PLATELET  ?D-DIMER, QUANTITATIVE  ?I-STAT BETA HCG BLOOD, ED ([facility], [facility], [facility] ONLY)   ?TROPONIN I (HIGH SENSITIVITY)  ?TROPONIN I (HIGH SENSITIVITY)  ? ? ?EKG ?EKG Interpretation ? ?Date/Time:  Sunday April 30 2021 00:18:02 EDT ?Ventricular Rate:  56 ?PR Interval:  175 ?QRS Duration: 85 ?QT Interval:  416 ?QTC Calculation: 402 ?R Axis:   75 ?Text Interpretation: Sinus arrhythmia Probable anteroseptal infarct, old Minimal ST elevation, inferior leads No significant change since last tracing Confirmed by Orpah Greek [id number]) on 04/30/2021 12:21:04 AM ? ?Radiology ?DG Chest 2 View ? ?Result Date: 04/29/2021 ?CLINICAL DATA:  Chest and back pain. EXAM: CHEST - 2 VIEW COMPARISON:  None. FINDINGS: The heart size and mediastinal contours are within normal limits. Both lungs are clear. 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 IMPRESSION: No active cardiopulmonary disease. Electronically Signed   By: Telford Nab M.D.   On: 04/29/2021 23:22   ? ?Procedures ?Procedures  ? ? ?Medications Ordered in ED ?Medications  ?morphine (PF) 4 MG/ML injection 4 mg (4 mg Intravenous Given 04/30/21 0036)  ?ondansetron [facility]) injection 4 mg (4 mg Intravenous Given 04/30/21 0036)  ?ketorolac (TORADOL) 15 MG/ML injection 15 mg (15 mg Intravenous Given 04/30/21 0322)  ? ? ?ED Course/ Medical Decision Making/ A&P ?  ?                        ?Medical Decision Making ?Amount and/or Complexity of Data Reviewed ?Labs: ordered. ? ?Risk ?Prescription drug management. ? ? ?Patient presents to the emergency department with chest pain. Patient nontoxic appearing, in no apparent distress, vitals without significant abnormality, BP mildly elevated on arrival.  ? ?DDX including but not limited to: ACS, pulmonary embolism, dissection, pneumothorax, pneumonia, arrhythmia, severe anemia, MSK, GERD, anxiety, abdominal process.  ? ?Additional history obtained:  ?Chart & nursing note reviewed.  ? ? ?EKG: Sinus arrhythmia Probable anteroseptal infarct, old  Minimal ST elevation, inferior leads No significant change since last tracing ? ?Lab Tests:  ?I reviewed & interpreted labs including:  ?CBC, BMP, troponins, pregnancy test, D-dimer: Grossly unremarkable, mild hypercalcemia will need PCP recheck. ? ?Imaging Studies ordered:  ?I viewed the following imaging, agree with radiologist impression:  ?No active cardiopulmonary disease. ? ?ED Course:  ?I ordered medications including morphine and Zofran for symptomatic care. ? ?On reassessment patient is feeling much better. ?Heart Pathway Score low risk- EKG without obvious acute ischemia, delta troponin negative, doubt ACS. Patient is low risk wells, ddimer negative, doubt pulmonary embolism. Pain is not a tearing sensation, symmetric pulses, no widening of mediastinum on CXR, doubt dissection.  EKG does not seem consistent w/ pericarditis. Chest x-ray and laboratory testing are reassuring.  Repeat abdominal exam remains nontender without peritoneal signs, low suspicion for acute surgical abdominal process.  Patient with symmetric pulses, ambulatory moving all extremities appropriately.  Unclear definitive etiology, possibly musculoskeletal given reproducibility with position changes and certain movements.  Given Toradol in the ED.  She overall seems reasonable for discharge home with supportive care. ? ?Based on patient's chief complaint, I considered admission might be necessary, however after reassuring ED workup feel patient is reasonable for discharge.  ? ?I discussed results, treatment plan, need for PCP follow-up, and return precautions with the patient. Provided opportunity for questions, patient confirmed understanding and is in agreement with plan.  ? ? ?Portions of this note were generated with Lobby[employer]. Dictation errors may occur despite best attempts at proofreading. ? ? ?Final Clinical Impression(s) / ED Diagnoses ?Final diagnoses:  ?Chest pain, unspecified type  ? ? ?Rx / DC Orders ?ED  Discharge Orders   ? ?      Ordered  ?  naproxen (NAPROSYN) 500 MG tablet  2 times daily PRN       ? 04/30/21 0347  ?  methocarbamol (ROBAXIN) 500 MG tablet  Every 8 hours PRN       ? 04/30/21 0347  ?  ondansetron (ZOFRAN-OD

## 2021-04-30 NOTE — Discharge Instructions (Addendum)
You were seen in the emergency department today for chest pain. Your work-up in the emergency department has been overall reassuring. Your labs have been fairly normal and or similar to previous blood work you have had done. Your EKG and the enzyme we use to check your heart did not show an acute heart attack at this time. Your chest x-ray was normal. Your blood clot test was negative.  ? ?We are sending you home with the following medicines to try to help your symptoms: ?- Naproxen- this is a nonsteroidal anti-inflammatory medication that will help with pain and swelling. Be sure to take this medication as prescribed with food, 1 pill every 12 hours,  It should be taken with food, as it can cause stomach upset, and more seriously, stomach bleeding. Do not take other nonsteroidal anti-inflammatory medications with this such as Advil, Motrin, Aleve, Mobic, Goodie Powder, or Motrin etc..   ? ?- Robaxin- this is the muscle relaxer I have prescribed, this is meant to help with muscle tightness/spasms. Be aware that this medication may make you drowsy therefore the first time you take this it should be at a time you are in an environment where you can rest. Do not drive or operate heavy machinery when taking this medication. Do not drink alcohol or take other sedating medications with this medicine such as narcotics or benzodiazepines.  ? ?- Lidoderm patch- Apply 1 patch to your area of most significant pain once per day to help numb/soothe this area. Remove & discard patch within 12 hours of application.  ? ?- Zofran-take every 8 hours as needed for nausea and vomiting. ? ?You make take Tylenol per over the counter dosing with these medications.  ? ?We have prescribed you new medication(s) today. Discuss the medications prescribed today with your pharmacist as they can have adverse effects and interactions with your other medicines including over the counter and prescribed medications. Seek medical evaluation if you  start to experience new or abnormal symptoms after taking one of these medicines, seek care immediately if you start to experience difficulty breathing, feeling of your throat closing, facial swelling, or rash as these could be indications of a more serious allergic reaction ? ? ?We would like you to follow up closely with your primary care provider and/or the cardiologist provided in your discharge instructions within 1-3 days. Return to the ER immediately should you experience any new or worsening symptoms including but not limited to return of pain, worsened pain, vomiting, shortness of breath, dizziness, lightheadedness, passing out, or any other concerns that you may have.  ? ?

## 2022-03-18 ENCOUNTER — Emergency Department (HOSPITAL_COMMUNITY)
Admission: EM | Admit: 2022-03-18 | Discharge: 2022-03-18 | Disposition: A | Discharge status: Home / Self Care | Payer: 59 | Attending: Emergency Medicine | Admitting: Emergency Medicine

## 2022-03-18 ENCOUNTER — Other Ambulatory Visit: Payer: Self-pay

## 2022-03-18 ENCOUNTER — Emergency Department (HOSPITAL_COMMUNITY): Payer: 59

## 2022-03-18 DIAGNOSIS — R0789 Other chest pain: Secondary | ICD-10-CM | POA: Diagnosis present

## 2022-03-18 DIAGNOSIS — G8911 Acute pain due to trauma: Secondary | ICD-10-CM | POA: Insufficient documentation

## 2022-03-18 DIAGNOSIS — Y92410 Unspecified street and highway as the place of occurrence of the external cause: Secondary | ICD-10-CM | POA: Diagnosis not present
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
## 2022-07-23 LAB — EXTERNAL GENERIC LAB PROCEDURE: COLOGUARD: NEGATIVE

## 2024-01-09 IMAGING — CR DG CHEST 2V
2 series · 2 of 2 positions shown · non-contrast
Comparison: None.

CLINICAL DATA: Chest and back pain.

EXAM:
CHEST - 2 VIEW

[chest pa]
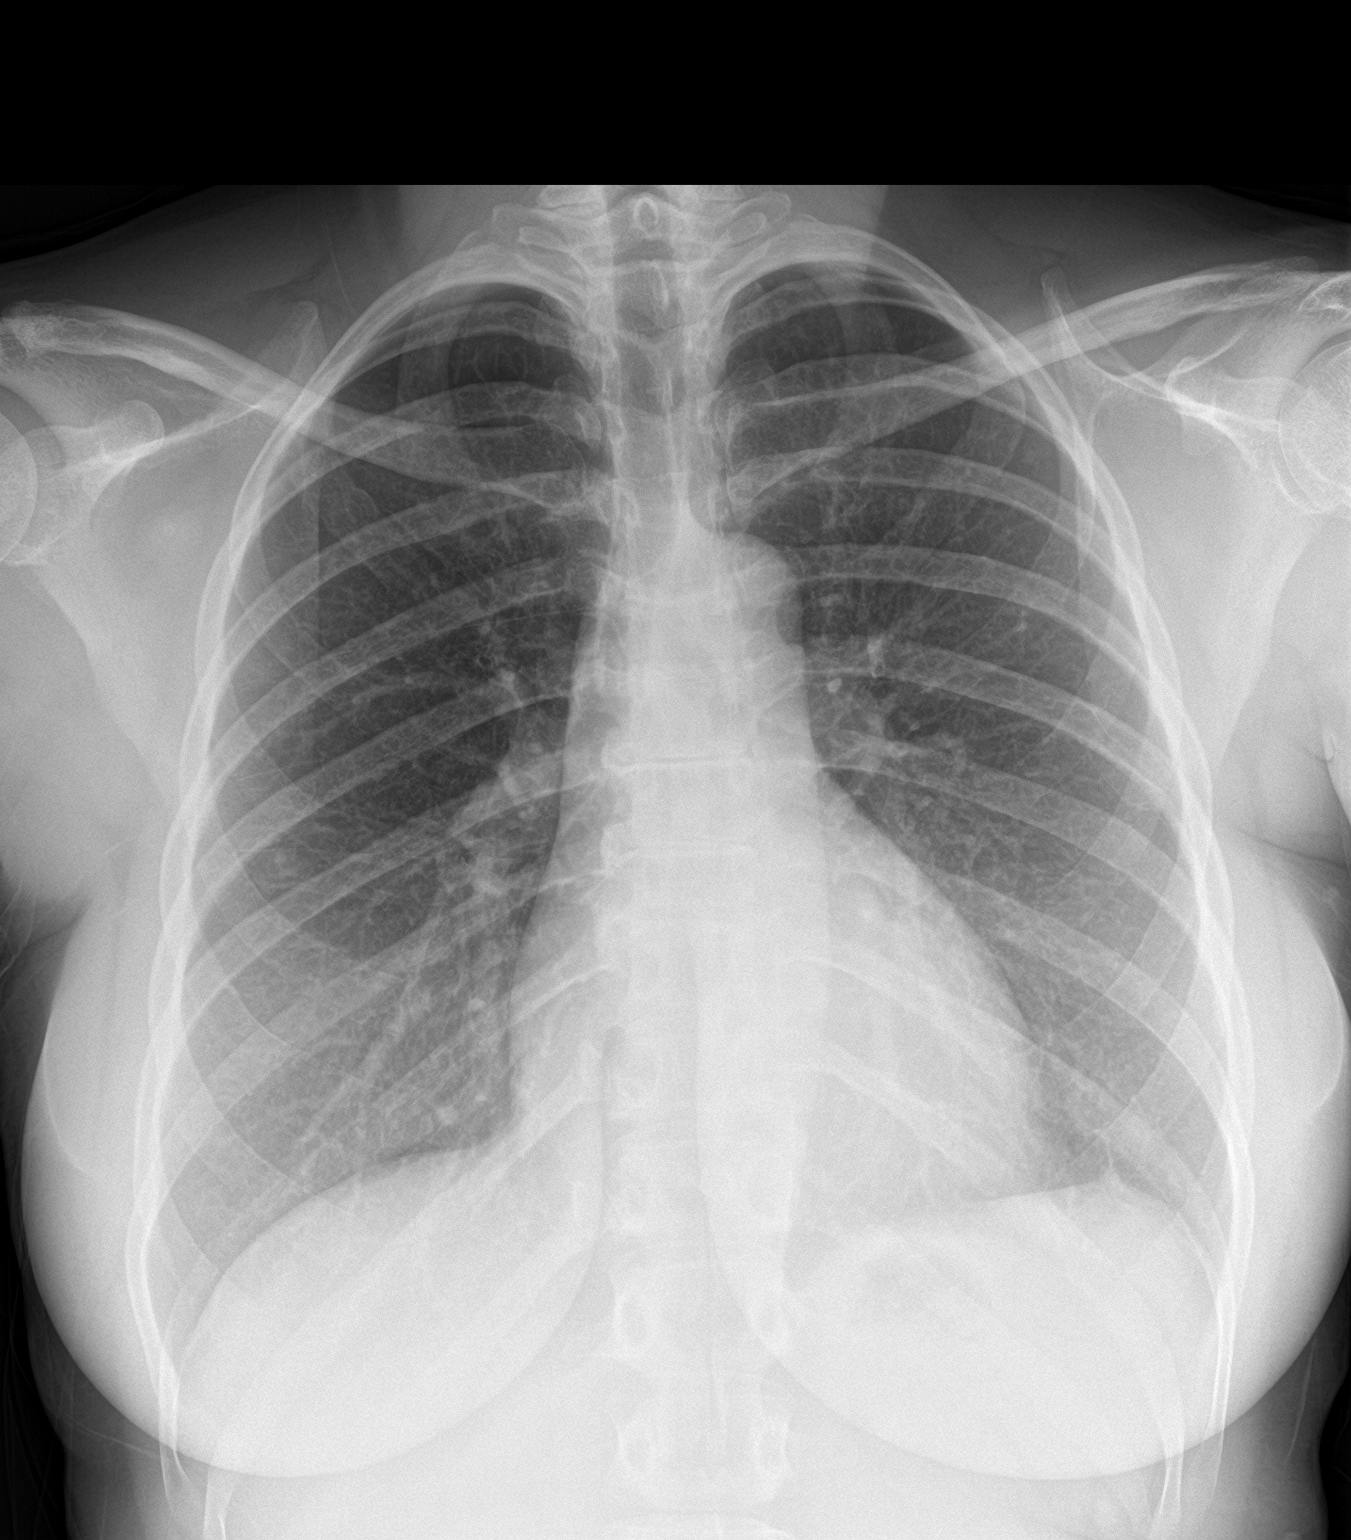

[chest lat]
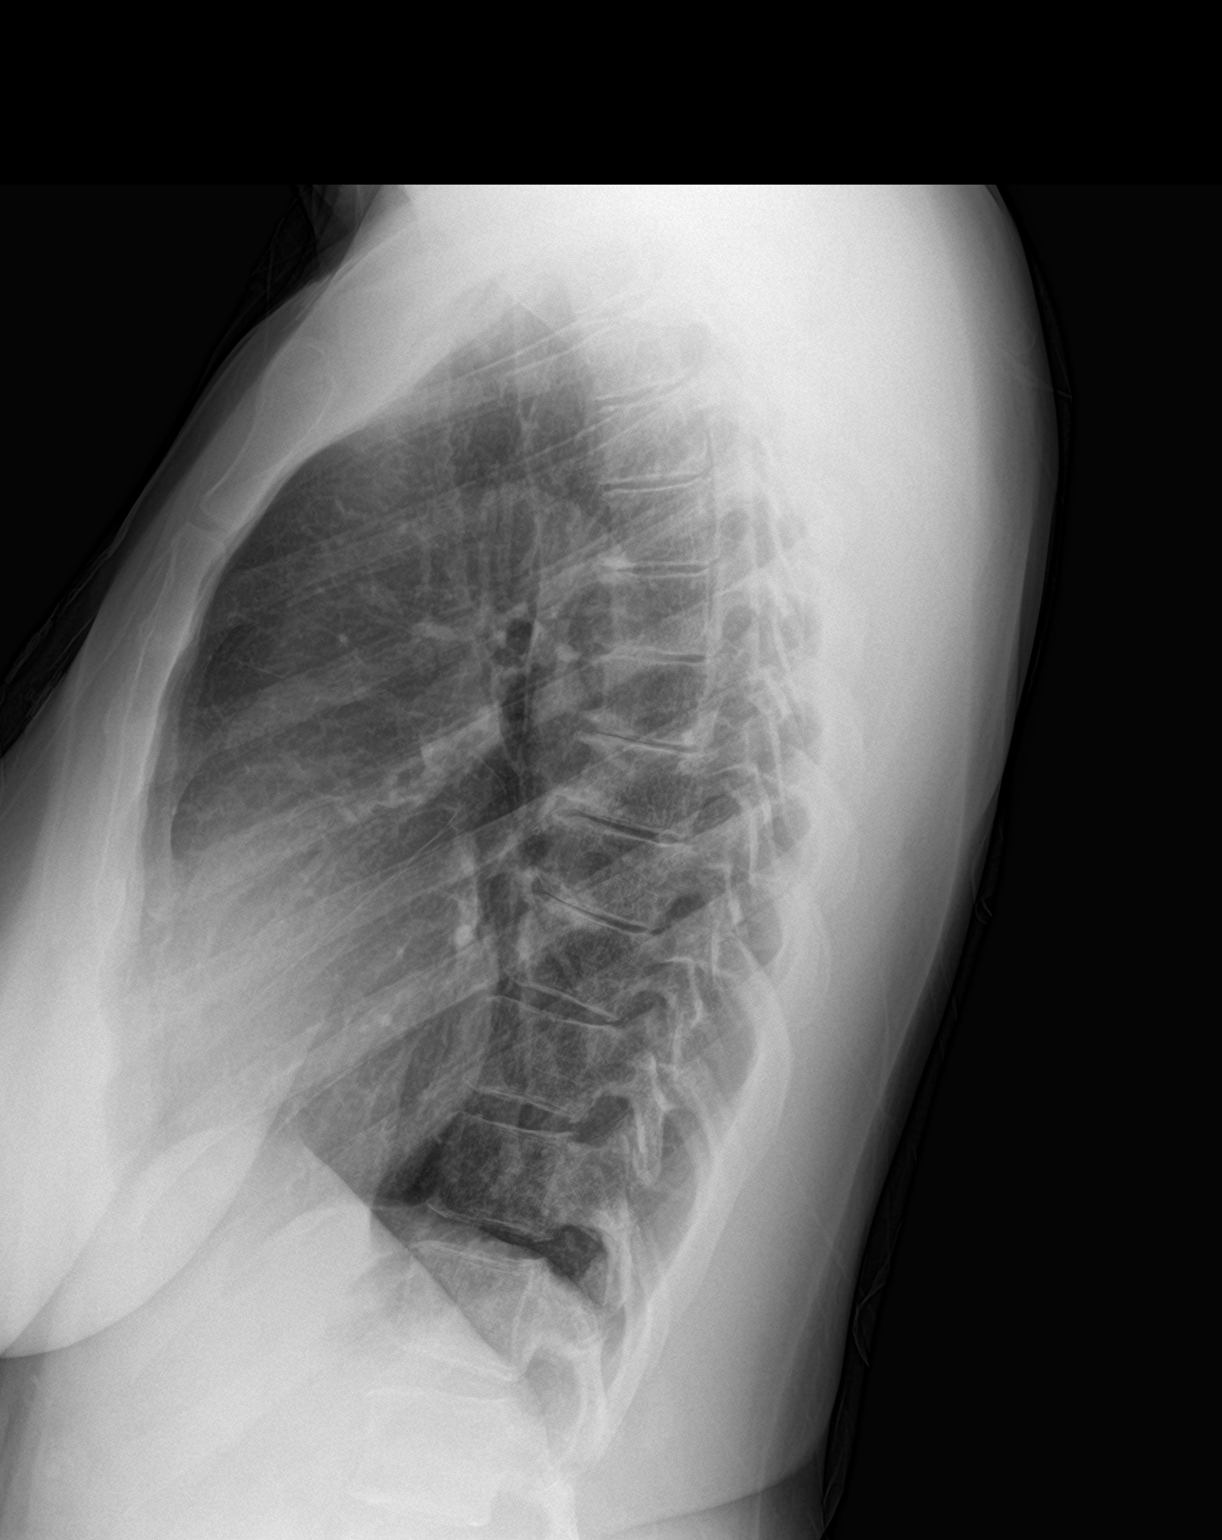

[2 of 2 positions shown; findings below may reference images not displayed]

FINDINGS: The heart size and mediastinal contours are within normal limits.
Both lungs are clear. The visualized skeletal structures are intact,
with degenerative disc disease and spondylosis in the midthoracic
spine.
IMPRESSION: No active cardiopulmonary disease.
